# Patient Record
Sex: Female | Born: 1985 | Race: Black or African American | Hispanic: No | Marital: Single | Smoking: Former smoker
Health system: Southern US, Community
[De-identification: ages and names within clinical notes are randomized; demographics above are authoritative.]

## PROBLEM LIST (undated history)

## (undated) ENCOUNTER — Inpatient Hospital Stay: Payer: Self-pay

## (undated) DIAGNOSIS — O1420 HELLP syndrome (HELLP), unspecified trimester: Secondary | ICD-10-CM

## (undated) DIAGNOSIS — E119 Type 2 diabetes mellitus without complications: Secondary | ICD-10-CM

## (undated) DIAGNOSIS — I10 Essential (primary) hypertension: Secondary | ICD-10-CM

## (undated) DIAGNOSIS — G43909 Migraine, unspecified, not intractable, without status migrainosus: Secondary | ICD-10-CM

## (undated) DIAGNOSIS — O1490 Unspecified pre-eclampsia, unspecified trimester: Secondary | ICD-10-CM

## (undated) DIAGNOSIS — D649 Anemia, unspecified: Secondary | ICD-10-CM

## (undated) DIAGNOSIS — R4020 Unspecified coma: Secondary | ICD-10-CM

## (undated) HISTORY — DX: Unspecified coma: R40.20

## (undated) HISTORY — DX: Unspecified pre-eclampsia, unspecified trimester: O14.90

## (undated) HISTORY — PX: NO PAST SURGERIES: SHX2092

---

## 2010-08-24 ENCOUNTER — Emergency Department: Payer: Self-pay | Admitting: Emergency Medicine

## 2010-08-29 ENCOUNTER — Emergency Department: Payer: Self-pay | Admitting: Emergency Medicine

## 2012-01-19 DIAGNOSIS — O139 Gestational [pregnancy-induced] hypertension without significant proteinuria, unspecified trimester: Secondary | ICD-10-CM

## 2013-09-20 DIAGNOSIS — O24419 Gestational diabetes mellitus in pregnancy, unspecified control: Secondary | ICD-10-CM

## 2013-09-20 DIAGNOSIS — O139 Gestational [pregnancy-induced] hypertension without significant proteinuria, unspecified trimester: Secondary | ICD-10-CM

## 2015-08-12 ENCOUNTER — Other Ambulatory Visit: Payer: Self-pay | Admitting: Family Medicine

## 2015-08-12 DIAGNOSIS — R55 Syncope and collapse: Secondary | ICD-10-CM

## 2015-08-12 DIAGNOSIS — G44009 Cluster headache syndrome, unspecified, not intractable: Secondary | ICD-10-CM

## 2015-08-13 ENCOUNTER — Ambulatory Visit
Admission: RE | Admit: 2015-08-13 | Discharge: 2015-08-13 | Disposition: A | Discharge status: Home / Self Care | Payer: Medicaid Other | Source: Ambulatory Visit | Attending: Family Medicine | Admitting: Family Medicine

## 2015-08-13 DIAGNOSIS — R55 Syncope and collapse: Secondary | ICD-10-CM | POA: Diagnosis present

## 2015-08-13 DIAGNOSIS — R040 Epistaxis: Secondary | ICD-10-CM | POA: Insufficient documentation

## 2015-08-13 DIAGNOSIS — G44009 Cluster headache syndrome, unspecified, not intractable: Secondary | ICD-10-CM | POA: Diagnosis present

## 2015-08-13 MED ORDER — GADOBENATE DIMEGLUMINE 529 MG/ML IV SOLN
15.0000 mL | Freq: Once | INTRAVENOUS | Status: AC | PRN
  Administered 2015-08-13: 15 mL via INTRAVENOUS

## 2015-11-28 ENCOUNTER — Emergency Department (HOSPITAL_COMMUNITY): Payer: Medicaid Other

## 2015-11-28 ENCOUNTER — Emergency Department (HOSPITAL_COMMUNITY)
Admission: EM | Admit: 2015-11-28 | Discharge: 2015-11-28 | Disposition: A | Discharge status: Home / Self Care | Payer: Medicaid Other | Attending: Emergency Medicine | Admitting: Emergency Medicine

## 2015-11-28 ENCOUNTER — Encounter (HOSPITAL_COMMUNITY): Payer: Self-pay | Admitting: Emergency Medicine

## 2015-11-28 DIAGNOSIS — R102 Pelvic and perineal pain: Secondary | ICD-10-CM | POA: Diagnosis present

## 2015-11-28 DIAGNOSIS — N760 Acute vaginitis: Secondary | ICD-10-CM | POA: Insufficient documentation

## 2015-11-28 DIAGNOSIS — B9689 Other specified bacterial agents as the cause of diseases classified elsewhere: Secondary | ICD-10-CM

## 2015-11-28 DIAGNOSIS — Z79899 Other long term (current) drug therapy: Secondary | ICD-10-CM | POA: Diagnosis not present

## 2015-11-28 DIAGNOSIS — E119 Type 2 diabetes mellitus without complications: Secondary | ICD-10-CM | POA: Diagnosis not present

## 2015-11-28 DIAGNOSIS — N83202 Unspecified ovarian cyst, left side: Secondary | ICD-10-CM | POA: Diagnosis not present

## 2015-11-28 DIAGNOSIS — I10 Essential (primary) hypertension: Secondary | ICD-10-CM | POA: Diagnosis not present

## 2015-11-28 DIAGNOSIS — F17210 Nicotine dependence, cigarettes, uncomplicated: Secondary | ICD-10-CM | POA: Insufficient documentation

## 2015-11-28 HISTORY — DX: Essential (primary) hypertension: I10

## 2015-11-28 HISTORY — DX: Type 2 diabetes mellitus without complications: E11.9

## 2015-11-28 HISTORY — DX: Migraine, unspecified, not intractable, without status migrainosus: G43.909

## 2015-11-28 HISTORY — DX: Anemia, unspecified: D64.9

## 2015-11-28 LAB — CBC WITH DIFFERENTIAL/PLATELET
BASOS ABS: 0 10*3/uL (ref 0.0–0.1)
Basophils Relative: 0 %
EOS PCT: 1 %
Eosinophils Absolute: 0 10*3/uL (ref 0.0–0.7)
HEMATOCRIT: 34.1 % — AB (ref 36.0–46.0)
Hemoglobin: 11.4 g/dL — ABNORMAL LOW (ref 12.0–15.0)
Lymphocytes Relative: 61 %
Lymphs Abs: 2.1 10*3/uL (ref 0.7–4.0)
MCH: 28.6 pg (ref 26.0–34.0)
MCHC: 33.4 g/dL (ref 30.0–36.0)
MCV: 85.5 fL (ref 78.0–100.0)
MONO ABS: 0.2 10*3/uL (ref 0.1–1.0)
MONOS PCT: 7 %
NEUTROS ABS: 1 10*3/uL — AB (ref 1.7–7.7)
Neutrophils Relative %: 31 %
PLATELETS: 255 10*3/uL (ref 150–400)
RBC: 3.99 MIL/uL (ref 3.87–5.11)
RDW: 13 % (ref 11.5–15.5)
WBC: 3.4 10*3/uL — AB (ref 4.0–10.5)

## 2015-11-28 LAB — URINALYSIS, ROUTINE W REFLEX MICROSCOPIC
BILIRUBIN URINE: NEGATIVE
GLUCOSE, UA: NEGATIVE mg/dL
Ketones, ur: NEGATIVE mg/dL
Leukocytes, UA: NEGATIVE
Nitrite: NEGATIVE
PROTEIN: NEGATIVE mg/dL
pH: 6 (ref 5.0–8.0)

## 2015-11-28 LAB — WET PREP, GENITAL
SPERM: NONE SEEN
Trich, Wet Prep: NONE SEEN
YEAST WET PREP: NONE SEEN

## 2015-11-28 LAB — COMPREHENSIVE METABOLIC PANEL
ALBUMIN: 3.7 g/dL (ref 3.5–5.0)
ALT: 12 U/L — ABNORMAL LOW (ref 14–54)
ANION GAP: 2 — AB (ref 5–15)
AST: 12 U/L — AB (ref 15–41)
Alkaline Phosphatase: 42 U/L (ref 38–126)
BILIRUBIN TOTAL: 0.8 mg/dL (ref 0.3–1.2)
BUN: 8 mg/dL (ref 6–20)
CHLORIDE: 108 mmol/L (ref 101–111)
CO2: 27 mmol/L (ref 22–32)
Calcium: 8.6 mg/dL — ABNORMAL LOW (ref 8.9–10.3)
Creatinine, Ser: 0.75 mg/dL (ref 0.44–1.00)
GFR calc Af Amer: >60 mL/min (ref >60)
GLUCOSE: 92 mg/dL (ref 65–99)
POTASSIUM: 3.4 mmol/L — AB (ref 3.5–5.1)
Sodium: 137 mmol/L (ref 135–145)
TOTAL PROTEIN: 6.3 g/dL — AB (ref 6.5–8.1)

## 2015-11-28 LAB — URINE MICROSCOPIC-ADD ON

## 2015-11-28 LAB — PREGNANCY, URINE: Preg Test, Ur: NEGATIVE

## 2015-11-28 MED ORDER — NAPROXEN 250 MG PO TABS: 250.0000 mg | ORAL_TABLET | Freq: Two times a day (BID) | ORAL | Status: DC | PRN

## 2015-11-28 MED ORDER — METRONIDAZOLE 500 MG PO TABS: 500.0000 mg | ORAL_TABLET | Freq: Two times a day (BID) | ORAL | Status: DC

## 2015-11-28 NOTE — Discharge Instructions (Signed)
Take the prescriptions as directed.  Apply moist heat to the area(s) of discomfort, for 15 minutes at a time, several times per day for the next few days.  Do not fall asleep on a heating pack.  Call the OB/GYN doctor on Monday to schedule a follow up appointment this week.  Return to the Emergency Department immediately if worsening.

## 2015-11-28 NOTE — ED Provider Notes (Signed)
CSN: [id number]     Arrival date & time 11/28/15  1018 History   First MD Initiated Contact with Patient 11/28/15 1047     Chief Complaint  Patient presents with  . Pelvic Pain      HPI Pt was seen at 1045. Per pt, c/o gradual onset and persistence of constant vaginal discharge for the past several days. Has been associated with pelvic "pain."  Denies dysuria/hematuria, no flank pain, no N/V/D, no fevers, no rash.     Past Medical History  Diagnosis Date  . Diabetes mellitus without complication (HCC)   . Hypertension   . Anemia   . Migraines    History reviewed. No pertinent past surgical history.   Family History  Problem Relation Age of Onset  . Stroke Brother   . Seizures Other   . Hypertension Other    Social History  Substance Use Topics  . Smoking status: Current Some Day Smoker    Types: Cigars  . Smokeless tobacco: Never Used  . Alcohol Use: No   OB History    Gravida Para Term Preterm AB TAB SAB Ectopic Multiple Living   4 3 3  1  1   3      Review of Systems ROS: Statement: All systems negative except as marked or noted in the HPI; Constitutional: Negative for fever and chills. ; ; Eyes: Negative for eye pain, redness and discharge. ; ; ENMT: Negative for ear pain, hoarseness, nasal congestion, sinus pressure and sore throat. ; ; Cardiovascular: Negative for chest pain, palpitations, diaphoresis, dyspnea and peripheral edema. ; ; Respiratory: Negative for cough, wheezing and stridor. ; ; Gastrointestinal: Negative for nausea, vomiting, diarrhea, abdominal pain, blood in stool, hematemesis, jaundice and rectal bleeding. . ; ; Genitourinary: Negative for dysuria, flank pain and hematuria. ; ; GYN: +pelvic pain, no vaginal bleeding, +vaginal discharge, no vulvar pain. ;;  Musculoskeletal: Negative for back pain and neck pain. Negative for swelling and trauma.; ; Skin: Negative for pruritus, rash, abrasions, blisters, bruising and skin lesion.; ; Neuro: Negative for  headache, lightheadedness and neck stiffness. Negative for weakness, altered level of consciousness, altered mental status, extremity weakness, paresthesias, involuntary movement, seizure and syncope.      Allergies  Mushroom extract complex  Home Medications   Prior to Admission medications   Medication Sig Start Date End Date Taking? Authorizing Provider  amitriptyline (ELAVIL) 25 MG tablet Take 25 mg by mouth at bedtime.   Yes Historical Provider, MD  hydrOXYzine (ATARAX/VISTARIL) 10 MG tablet Take 10 mg by mouth 3 (three) times daily as needed for anxiety.   Yes Historical Provider, MD  rizatriptan (MAXALT) 10 MG tablet Take 10 mg by mouth as needed for migraine. May repeat in 2 hours if needed   Yes Historical Provider, MD  sertraline (ZOLOFT) 25 MG tablet Take 50 mg by mouth daily.   Yes Historical Provider, MD   BP 126/97 mmHg  Pulse 62  Temp(Src) 98 F (36.7 C) (Oral)  Resp 18  Ht 5' 5.5" (1.664 m)  Wt 155 lb (70.308 kg)  BMI 25.39 kg/m2  SpO2 100%  LMP 11/12/2015 Physical Exam  1050: Physical examination:  Nursing notes reviewed; Vital signs and O2 SAT reviewed;  Constitutional: Well developed, Well nourished, Well hydrated, In no acute distress; Head:  Normocephalic, atraumatic; Eyes: EOMI, PERRL, No scleral icterus; ENMT: Mouth and pharynx normal, Mucous membranes moist; Neck: Supple, Full range of motion, No lymphadenopathy; Cardiovascular: Regular rate and rhythm, No murmur, rub,  or gallop; Respiratory: Breath sounds clear & equal bilaterally, No rales, rhonchi, wheezes.  Speaking full sentences with ease, Normal respiratory effort/excursion; Chest: Nontender, Movement normal; Abdomen: Soft, +suprapubic area tender to palp. No rebound or guarding. Nondistended, Normal bowel sounds; Genitourinary: No CVA tenderness. Pelvic exam performed with permission of pt and female ED tech assist during exam.  External genitalia w/o lesions. Vaginal vault with thick yellow discharge.   Cervix w/o lesions, not friable, GC/chlam and wet prep obtained and sent to lab.  Bimanual exam w/o CMT. +uterine > mild bilateral adnexal tenderness.; Extremities: Pulses normal, No tenderness, No edema, No calf edema or asymmetry.; Neuro: AA&Ox3, Major CN grossly intact.  Speech clear. No gross focal motor or sensory deficits in extremities.; Skin: Color normal, Warm, Dry.   ED Course  Procedures (including critical care time) Labs Review  Imaging Review  I have personally reviewed and evaluated these images and lab results as part of my medical decision-making.   EKG Interpretation None      MDM  MDM Reviewed: previous chart, nursing note and vitals Reviewed previous: labs Interpretation: labs and ultrasound      Results for orders placed or performed during the hospital encounter of 11/28/15  Wet prep, genital  Result Value Ref Range   Yeast Wet Prep HPF POC NONE SEEN NONE SEEN   Trich, Wet Prep NONE SEEN NONE SEEN   Clue Cells Wet Prep HPF POC PRESENT (A) NONE SEEN   WBC, Wet Prep HPF POC FEW (A) NONE SEEN   Sperm NONE SEEN   Urinalysis, Routine w reflex microscopic (not at [facility])  Result Value Ref Range   Color, Urine YELLOW YELLOW   APPearance HAZY (A) CLEAR   Specific Gravity, Urine >1.030 (H) 1.005 - 1.030   pH 6.0 5.0 - 8.0   Glucose, UA NEGATIVE NEGATIVE mg/dL   Hgb urine dipstick TRACE (A) NEGATIVE   Bilirubin Urine NEGATIVE NEGATIVE   Ketones, ur NEGATIVE NEGATIVE mg/dL   Protein, ur NEGATIVE NEGATIVE mg/dL   Nitrite NEGATIVE NEGATIVE   Leukocytes, UA NEGATIVE NEGATIVE  CBC with Differential  Result Value Ref Range   WBC 3.4 (L) 4.0 - 10.5 K/uL   RBC 3.99 3.87 - 5.11 MIL/uL   Hemoglobin 11.4 (L) 12.0 - 15.0 g/dL   HCT 96.034.1 (L) 45.436.0 - 09.846.0 %   MCV 85.5 78.0 - 100.0 fL   MCH 28.6 26.0 - 34.0 pg   MCHC 33.4 30.0 - 36.0 g/dL   RDW 11.913.0 14.711.5 - 82.915.5 %   Platelets 255 150 - 400 K/uL   Neutrophils Relative % 31 %   Neutro Abs 1.0 (L) 1.7 - 7.7 K/uL    Lymphocytes Relative 61 %   Lymphs Abs 2.1 0.7 - 4.0 K/uL   Monocytes Relative 7 %   Monocytes Absolute 0.2 0.1 - 1.0 K/uL   Eosinophils Relative 1 %   Eosinophils Absolute 0.0 0.0 - 0.7 K/uL   Basophils Relative 0 %   Basophils Absolute 0.0 0.0 - 0.1 K/uL  Comprehensive metabolic panel  Result Value Ref Range   Sodium 137 135 - 145 mmol/L   Potassium 3.4 (L) 3.5 - 5.1 mmol/L   Chloride 108 101 - 111 mmol/L   CO2 27 22 - 32 mmol/L   Glucose, Bld 92 65 - 99 mg/dL   BUN 8 6 - 20 mg/dL   Creatinine, Ser 5.620.75 0.44 - 1.00 mg/dL   Calcium 8.6 (L) 8.9 - 10.3 mg/dL   Total Protein 6.3 (  L) 6.5 - 8.1 g/dL   Albumin 3.7 3.5 - 5.0 g/dL   AST 12 (L) 15 - 41 U/L   ALT 12 (L) 14 - 54 U/L   Alkaline Phosphatase 42 38 - 126 U/L   Total Bilirubin 0.8 0.3 - 1.2 mg/dL   GFR calc non Af Amer >60 >60 mL/min   GFR calc Af Amer >60 >60 mL/min   Anion gap 2 (L) 5 - 15  Pregnancy, urine  Result Value Ref Range   Preg Test, Ur NEGATIVE NEGATIVE  Urine microscopic-add on  Result Value Ref Range   Squamous Epithelial / LPF 6-30 (A) NONE SEEN   WBC, UA 0-5 0 - 5 WBC/hpf   RBC / HPF 0-5 0 - 5 RBC/hpf   Bacteria, UA MANY (A) NONE SEEN   Urine-Other MUCOUS PRESENT    US Pelvis Complete 11/28/2015  CLINICAL DATA:  Acute diffuse pelvic pain since yesterday. EXAM: TRANSABDOMINAL AND TRANSVAGINAL ULTRASOUND OF PELVIS DOPPLER ULTRASOUND OF OVARIES TECHNIQUE: Both transabdominal and transvaginal ultrasound examinations of the pelvis were performed. Transabdominal technique was performed for global imaging of the pelvis including uterus, ovaries, adnexal regions, and pelvic cul-de-sac. It was necessary to proceed with endovaginal exam following the transabdominal exam to visualize the uterus and adnexae in better detail. Color and duplex Doppler ultrasound was utilized to evaluate blood flow to the ovaries. COMPARISON:  08/29/2010 FINDINGS: Uterus Measurements: 7.9 x 3.5 x 6.0 cm. No fibroids or other mass visualized.  Endometrium Thickness: 11.7 mm.  No focal abnormality visualized. Right ovary Measurements: 4.1 x 2.4 x 3.0 cm. Normal appearance/no adnexal mass. Left ovary Measurements: 4.9 x 2.7 x 3.5 cm. Left ovary contains a minimally complex septated hypoechoic cyst measuring 22 x 16 x 20 mm. Pulsed Doppler evaluation of both ovaries demonstrates normal low-resistance arterial and venous waveforms. Other findings Small amount of free fluid in the cul-de-sac noted, suspect physiologic. IMPRESSION: 2.2 cm left ovarian minimally complex cyst. No other acute finding by ultrasound or Doppler evaluation. Electronically Signed   By: Judie Petit.  Shick M.D.   On: 11/28/2015 13:42    1425:  Ovarian cyst on [place]. Udip contaminated. Tx for BV. Pt states she is ready to go home now. Dx and testing d/w pt.  Questions answered.  Verb understanding, agreeable to d/c home with outpt f/u.   Samuel Jester, DO 11/30/15 2217

## 2015-11-28 NOTE — ED Notes (Addendum)
Patient c/o lower abd pain that started yesterday. Denies any nausea, vomiting, diarrhea, fevers, or vaginal bleeding. Patient does report some urinary retention. Patient also states "hot and cold flashes." Per patient last BM yesterday-normal, no blood noted.

## 2015-11-28 NOTE — ED Notes (Signed)
Reported that pt had IUD removed this past May

## 2015-11-30 LAB — GC/CHLAMYDIA PROBE AMP (~~LOC~~) NOT AT ARMC
CHLAMYDIA, DNA PROBE: NEGATIVE
Neisseria Gonorrhea: NEGATIVE

## 2016-03-19 ENCOUNTER — Encounter (HOSPITAL_COMMUNITY): Payer: Self-pay | Admitting: Emergency Medicine

## 2016-03-19 ENCOUNTER — Emergency Department (HOSPITAL_COMMUNITY): Payer: Medicaid Other

## 2016-03-19 ENCOUNTER — Emergency Department (HOSPITAL_COMMUNITY)
Admission: EM | Admit: 2016-03-19 | Discharge: 2016-03-19 | Disposition: A | Discharge status: Home / Self Care | Payer: Medicaid Other | Attending: Emergency Medicine | Admitting: Emergency Medicine

## 2016-03-19 DIAGNOSIS — Z79899 Other long term (current) drug therapy: Secondary | ICD-10-CM | POA: Insufficient documentation

## 2016-03-19 DIAGNOSIS — Z3490 Encounter for supervision of normal pregnancy, unspecified, unspecified trimester: Secondary | ICD-10-CM

## 2016-03-19 DIAGNOSIS — O9989 Other specified diseases and conditions complicating pregnancy, childbirth and the puerperium: Secondary | ICD-10-CM | POA: Insufficient documentation

## 2016-03-19 DIAGNOSIS — I10 Essential (primary) hypertension: Secondary | ICD-10-CM | POA: Diagnosis not present

## 2016-03-19 DIAGNOSIS — F17290 Nicotine dependence, other tobacco product, uncomplicated: Secondary | ICD-10-CM | POA: Diagnosis not present

## 2016-03-19 DIAGNOSIS — N898 Other specified noninflammatory disorders of vagina: Secondary | ICD-10-CM | POA: Insufficient documentation

## 2016-03-19 DIAGNOSIS — O99331 Smoking (tobacco) complicating pregnancy, first trimester: Secondary | ICD-10-CM | POA: Diagnosis not present

## 2016-03-19 DIAGNOSIS — Z3A01 Less than 8 weeks gestation of pregnancy: Secondary | ICD-10-CM | POA: Insufficient documentation

## 2016-03-19 DIAGNOSIS — O0001 Abdominal pregnancy with intrauterine pregnancy: Secondary | ICD-10-CM | POA: Insufficient documentation

## 2016-03-19 DIAGNOSIS — O26891 Other specified pregnancy related conditions, first trimester: Secondary | ICD-10-CM | POA: Diagnosis present

## 2016-03-19 DIAGNOSIS — R102 Pelvic and perineal pain: Secondary | ICD-10-CM

## 2016-03-19 DIAGNOSIS — O26899 Other specified pregnancy related conditions, unspecified trimester: Secondary | ICD-10-CM

## 2016-03-19 DIAGNOSIS — E119 Type 2 diabetes mellitus without complications: Secondary | ICD-10-CM | POA: Diagnosis not present

## 2016-03-19 DIAGNOSIS — M545 Low back pain: Secondary | ICD-10-CM | POA: Insufficient documentation

## 2016-03-19 LAB — WET PREP, GENITAL
Sperm: NONE SEEN
TRICH WET PREP: NONE SEEN
Yeast Wet Prep HPF POC: NONE SEEN

## 2016-03-19 LAB — URINALYSIS, ROUTINE W REFLEX MICROSCOPIC
Bilirubin Urine: NEGATIVE
GLUCOSE, UA: NEGATIVE mg/dL
HGB URINE DIPSTICK: NEGATIVE
Ketones, ur: NEGATIVE mg/dL
Nitrite: NEGATIVE
PH: 6.5 (ref 5.0–8.0)
PROTEIN: NEGATIVE mg/dL
SPECIFIC GRAVITY, URINE: 1.02 (ref 1.005–1.030)

## 2016-03-19 LAB — HCG, QUANTITATIVE, PREGNANCY: HCG, BETA CHAIN, QUANT, S: 59658 m[IU]/mL — AB (ref <5)

## 2016-03-19 LAB — PREGNANCY, URINE: Preg Test, Ur: POSITIVE — AB

## 2016-03-19 LAB — URINE MICROSCOPIC-ADD ON: RBC / HPF: NONE SEEN RBC/hpf (ref 0–5)

## 2016-03-19 MED ORDER — ACETAMINOPHEN 325 MG PO TABS
650.0000 mg | ORAL_TABLET | Freq: Once | ORAL | Status: AC
  Administered 2016-03-19: 650 mg via ORAL
  Filled 2016-03-19: qty 2

## 2016-03-19 NOTE — ED Provider Notes (Signed)
[facility]EM[facility]Y DEPT Provider Note   CSN: [id number] Arrival date & time: 03/19/16  [id number]  By signing my name below, I, [name], attest that this documentation has been prepared under the direction and in the presence of Doug SouSam Latera Mclin, MD. Electronically Signed: Angelene GiovanniEmmanuella Mensah, ED Scribe. 03/19/16. 11:22 AM.   History   Chief Complaint Chief Complaint  Patient presents with  . 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 No nausea or vomiting. No fever. She reports associated pink colored vaginal discharge for the past 2 weeks and lower back pain. She notes that movement makes the pain worse but staying still makes the pain better. She states that she has tried Tylenol with no relief. She explains that she was seen on [facility]16/16 [facility] and was informed of her current pregnancy. She reports that her LNMP was in August and she had an "abnormal" menstrual cycle in September. She adds that it was abnormal because "it would come and go".Last normal menstrual period was August 2017 She states that this is her 5th pregnancy; 3 live children and one still birth. Pt has an allergy to Flagyl. She states that she is a current smoker. She denies any fever, vomiting, dysuria, vaginal pain, bowel/bladder incontinence, or any other symptoms.   The history is provided by the patient. No language interpreter was used.  Abdominal Pain   This is a new problem. The current episode started yesterday. The problem has been gradually improving. The pain is located in the LLQ. The pain is moderate. Pertinent negatives include fever, nausea, vomiting and dysuria. The symptoms are aggravated by certain positions.     Past Medical History:  Diagnosis Date  . Anemia   . Diabetes mellitus without complication (HCC)   . Hypertension   . Migraines     There are no active problems to display for this patient.   History reviewed. No pertinent surgical history.  OB History    Gravida Para Term Preterm AB Living   5 3 3   1 3    SAB TAB Ectopic Multiple Live Births   1               Home Medications    Prior to Admission medications   Medication Sig Start Date End Date Taking? Authorizing Provider  hydrOXYzine (ATARAX/VISTARIL) 10 MG tablet Take 10 mg by mouth 3 (three) times daily as needed for anxiety.   Yes Historical Provider, MD  Prenatal Vit-Fe Fumarate-FA (PRENATAL ONE DAILY PO) Take 1 tablet by mouth daily.   Yes Historical Provider, MD  naproxen (NAPROSYN) 250 MG tablet Take 1 tablet (250 mg total) by mouth 2 (two) times daily as needed for mild pain or moderate pain (take with food). 11/28/15   Samuel JesterKathleen McManus, DO  rizatriptan (MAXALT) 10 MG tablet Take 10 mg by mouth as needed for migraine. May repeat in 2 hours if needed    Historical Provider, MD  sertraline (ZOLOFT) 25 MG tablet Take 50 mg by mouth daily.    Historical Provider, MD  Renal vitamins  Family History Family History  Problem Relation Age of Onset  . Stroke Brother   . Seizures Other   . Hypertension Other     Social History  Social History  Substance Use Topics  . Smoking status: Current Some Day Smoker    Types: Cigars  . Smokeless tobacco: Never Used  . Alcohol use No   Denies tobacco and denies alcohol denies illicit drug use  Allergies   Mushroom extract complex and Flagyl [metronidazole]   Review of Systems Review of Systems  Constitutional: Negative.  Negative for fever.  HENT: Negative.   Respiratory: Negative.   Cardiovascular: Negative.   Gastrointestinal: Positive for abdominal pain. Negative for nausea and vomiting.  Genitourinary: Positive for vaginal discharge. Negative for dysuria  and vaginal pain.       Pregnant  Musculoskeletal: Positive for back pain.  Skin: Negative.   Neurological: Negative.   Psychiatric/Behavioral: Negative.   All other systems reviewed and are negative.    Physical Exam Updated Vital Signs BP 127/76 (BP Location: Right Arm)   Pulse 92   Temp 98.1 F (36.7 C) (Oral)   Resp 18   Ht 5' 5.5" (1.664 m)   Wt 157 lb (71.2 kg)   LMP 01/31/2016   SpO2 100%   BMI 25.73 kg/m   Physical Exam  Constitutional: She appears well-developed and well-nourished.  HENT:  Head: Normocephalic and atraumatic.  Eyes: Conjunctivae are normal. Pupils are equal, round, and reactive to light.  Neck: Neck supple. No tracheal deviation present. No thyromegaly present.  Cardiovascular: Normal rate and regular rhythm.   No murmur heard. Pulmonary/Chest: Effort normal and breath sounds normal.  Abdominal: Soft. Bowel sounds are normal. She exhibits no distension. There is no tenderness.  Genitourinary:  Genitourinary Comments: Left flank tenderness. Pelvic exam no external lesion. Thick yellow discharge. Cervical os closed. No cervical motion tenderness positive left adnexal tenderness  Musculoskeletal: Normal range of motion. She exhibits no edema or tenderness.  Neurological: She is alert. Coordination normal.  Skin: Skin is warm and dry. No rash noted.  Psychiatric: She has a normal mood and affect.  Nursing note and vitals reviewed.    ED Treatments / Results  DIAGNOSTIC STUDIES: Oxygen Saturation is 100% on RA, normal by my interpretation.    COORDINATION OF CARE: 11:19 AM- Pt advised of plan for treatment and pt agrees. Pt will receive pelvic examination and US pelvis for further evaluation.    Labs (all labs ordered are listed, but only abnormal results are displayed) Labs Reviewed  URINALYSIS, ROUTINE W REFLEX MICROSCOPIC (NOT AT [facility]) - Abnormal; Notable for the following:       Result Value   APPearance HAZY (*)    Leukocytes, UA TRACE  (*)    All other components within normal limits  PREGNANCY, URINE - Abnormal; Notable for the following:    Preg Test, Ur POSITIVE (*)    All other components within normal limits  URINE MICROSCOPIC-ADD ON - Abnormal; Notable for the following:    Squamous Epithelial / LPF 6-30 (*)    Bacteria, UA FEW (*)    All other components within normal limits    EKG  EKG Interpretation None       Radiology No results found.  Procedures Procedures (including critical care time)  Medications Ordered in ED Medications - No data to display   Initial Impression / Assessment and Plan / ED Course  Doug SouSam Saajan Willmon, MD has reviewed the triage vital signs and the nursing notes.  Pertinent labs & imaging results that were available during my care of the patient were reviewed by me and considered in my medical decision making (see chart for  details).  Clinical Course    3:05 PM patient asymptomatic except for mild left-sided low back pain. Plan Tylenol for pain. She is already taking prenatal vitamins HIV, RPR, cervical cultures pending Results for orders placed or performed during the hospital encounter of 03/19/16  Wet prep, genital  Result Value Ref Range   Yeast Wet Prep HPF POC NONE SEEN NONE SEEN   Trich, Wet Prep NONE SEEN NONE SEEN   Clue Cells Wet Prep HPF POC PRESENT (A) NONE SEEN   WBC, Wet Prep HPF POC RARE (A) NONE SEEN   Sperm NONE SEEN   Urinalysis, Routine w reflex microscopic (not at [facility])  Result Value Ref Range   Color, Urine YELLOW YELLOW   APPearance HAZY (A) CLEAR   Specific Gravity, Urine 1.020 1.005 - 1.030   pH 6.5 5.0 - 8.0   Glucose, UA NEGATIVE NEGATIVE mg/dL   Hgb urine dipstick NEGATIVE NEGATIVE   Bilirubin Urine NEGATIVE NEGATIVE   Ketones, ur NEGATIVE NEGATIVE mg/dL   Protein, ur NEGATIVE NEGATIVE mg/dL   Nitrite NEGATIVE NEGATIVE   Leukocytes, UA TRACE (A) NEGATIVE  Pregnancy, urine  Result Value Ref Range   Preg Test, Ur POSITIVE (A) NEGATIVE   Urine microscopic-add on  Result Value Ref Range   Squamous Epithelial / LPF 6-30 (A) NONE SEEN   WBC, UA 6-30 0 - 5 WBC/hpf   RBC / HPF NONE SEEN 0 - 5 RBC/hpf   Bacteria, UA FEW (A) NONE SEEN  hCG, quantitative, pregnancy  Result Value Ref Range   hCG, Beta Chain, Quant, S 59,658 (H) <5 mIU/mL   US Ob Comp Less 14 Wks  Result Date: 03/19/2016 CLINICAL DATA:  30 year old pregnant female with 12 hours of left lower quadrant pain. Quantitative beta HCG [id number]. EDC by LMP: 11/06/2016, projecting to an expected gestational age of [redacted] weeks 6 days. EXAM: OBSTETRIC <14 WK [place] AND TRANSVAGINAL OB US TECHNIQUE: Both transabdominal and transvaginal ultrasound examinations were performed for complete evaluation of the gestation as well as the maternal uterus, adnexal regions, and pelvic cul-de-sac. Transvaginal technique was performed to assess early pregnancy. COMPARISON:  No prior obstetric scans from this gestation. 11/28/2015 pelvic sonogram. FINDINGS: Intrauterine gestational sac: Single intrauterine gestational sac appears normal in size, shape and position. Yolk sac:  Present. Embryo:  Present. Embryonic Cardiac Activity: Regular rate and rhythm. Embryonic Heart Rate: 135  bpm CRL:  9.8  mm   7 w   0 d                  [place] EDC: 11/05/2016 Subchorionic hemorrhage:  None visualized. Maternal uterus/adnexae: Anteverted uterus with no uterine fibroids demonstrated. No abnormal free fluid the pelvis. Right ovary measures 4.7 x 2.3 x 4.9 cm and contains a 2.7 cm corpus luteum. Left ovary measures 3.5 x 1.8 x 2.6 cm. No suspicious ovarian or adnexal masses. IMPRESSION: 1. Single living intrauterine gestation at 7 weeks 0 days by crown-rump length, concordant with the provided menstrual dating. 2. No early first-trimester gestational abnormality. Electronically Signed   By: Delbert Phenix M.D.   On: 03/19/2016 13:28   US Ob Transvaginal  Result Date: 03/19/2016 CLINICAL DATA:  30 year old pregnant female with 12  hours of left lower quadrant pain. Quantitative beta HCG [id number]. EDC by LMP: 11/06/2016, projecting to an expected gestational age of [redacted] weeks 6 days. EXAM: OBSTETRIC <14 WK [place] AND TRANSVAGINAL OB US TECHNIQUE: Both transabdominal and transvaginal ultrasound examinations were performed for complete evaluation of the  gestation as well as the maternal uterus, adnexal regions, and pelvic cul-de-sac. Transvaginal technique was performed to assess early pregnancy. COMPARISON:  No prior obstetric scans from this gestation. 11/28/2015 pelvic sonogram. FINDINGS: Intrauterine gestational sac: Single intrauterine gestational sac appears normal in size, shape and position. Yolk sac:  Present. Embryo:  Present. Embryonic Cardiac Activity: Regular rate and rhythm. Embryonic Heart Rate: 135  bpm CRL:  9.8  mm   7 w   0 d                  [place] EDC: 11/05/2016 Subchorionic hemorrhage:  None visualized. Maternal uterus/adnexae: Anteverted uterus with no uterine fibroids demonstrated. No abnormal free fluid the pelvis. Right ovary measures 4.7 x 2.3 x 4.9 cm and contains a 2.7 cm corpus luteum. Left ovary measures 3.5 x 1.8 x 2.6 cm. No suspicious ovarian or adnexal masses. IMPRESSION: 1. Single living intrauterine gestation at 7 weeks 0 days by crown-rump length, concordant with the provided menstrual dating. 2. No early first-trimester gestational abnormality. Electronically Signed   By: Delbert Phenix M.D.   On: 03/19/2016 13:28   Abdominal pain is felt to be nonspecific, she did have left adnexal tenderness on pelvic exam but no abdominal tenderness. Back pain likely musculoskeletal in etiology Final Clinical Impressions(s) / ED Diagnoses   Final diagnoses:  None   Diagnosis #1 intrauterine pregnancy #2 left lower quadrant abdominal pain #3 low back pain New Prescriptions New Prescriptions   No medications on file        Doug Sou, MD 03/19/16 1514

## 2016-03-19 NOTE — ED Notes (Signed)
EDP at bedside. Pelvic to be performed

## 2016-03-19 NOTE — Discharge Instructions (Signed)
Ultrasound showed pregnancy to be normal and 7 weeks. Take Tylenol as directed for pain . Keep your scheduled appointment for prenatal care.

## 2016-03-19 NOTE — ED Triage Notes (Signed)
Patient c/o lower left abd pain and lower back pain. Per patient has some pink discharge. Patient states [facility] and found out she was pregnant. Per patient last period was September 17th.Per patient 5th pregnancy. Per patient has had still birth x1.

## 2016-03-19 NOTE — ED Notes (Signed)
Returned from U/S

## 2016-03-19 NOTE — ED Notes (Signed)
Pt transported to US

## 2016-03-20 LAB — HIV ANTIBODY (ROUTINE TESTING W REFLEX): HIV Screen 4th Generation wRfx: NONREACTIVE

## 2016-03-20 LAB — RPR: RPR: NONREACTIVE

## 2016-03-21 LAB — GC/CHLAMYDIA PROBE AMP (~~LOC~~) NOT AT ARMC
Chlamydia: NEGATIVE
Neisseria Gonorrhea: POSITIVE — AB

## 2016-04-18 ENCOUNTER — Emergency Department
Admission: EM | Admit: 2016-04-18 | Discharge: 2016-04-18 | Disposition: A | Discharge status: Home / Self Care | Payer: Medicaid Other | Attending: Emergency Medicine | Admitting: Emergency Medicine

## 2016-04-18 ENCOUNTER — Encounter: Payer: Self-pay | Admitting: Emergency Medicine

## 2016-04-18 DIAGNOSIS — O99351 Diseases of the nervous system complicating pregnancy, first trimester: Secondary | ICD-10-CM | POA: Diagnosis present

## 2016-04-18 DIAGNOSIS — Z3A11 11 weeks gestation of pregnancy: Secondary | ICD-10-CM | POA: Diagnosis not present

## 2016-04-18 DIAGNOSIS — Z79899 Other long term (current) drug therapy: Secondary | ICD-10-CM | POA: Insufficient documentation

## 2016-04-18 DIAGNOSIS — F17290 Nicotine dependence, other tobacco product, uncomplicated: Secondary | ICD-10-CM | POA: Insufficient documentation

## 2016-04-18 DIAGNOSIS — O99331 Smoking (tobacco) complicating pregnancy, first trimester: Secondary | ICD-10-CM | POA: Insufficient documentation

## 2016-04-18 DIAGNOSIS — I10 Essential (primary) hypertension: Secondary | ICD-10-CM | POA: Diagnosis not present

## 2016-04-18 DIAGNOSIS — E119 Type 2 diabetes mellitus without complications: Secondary | ICD-10-CM | POA: Insufficient documentation

## 2016-04-18 DIAGNOSIS — G44009 Cluster headache syndrome, unspecified, not intractable: Secondary | ICD-10-CM | POA: Insufficient documentation

## 2016-04-18 LAB — GLUCOSE, CAPILLARY: GLUCOSE-CAPILLARY: 113 mg/dL — AB (ref 65–99)

## 2016-04-18 MED ORDER — SODIUM CHLORIDE 0.9 % IV BOLUS (SEPSIS)
1000.0000 mL | Freq: Once | INTRAVENOUS | Status: AC
  Administered 2016-04-18: 1000 mL via INTRAVENOUS

## 2016-04-18 MED ORDER — METOCLOPRAMIDE HCL 5 MG/ML IJ SOLN
10.0000 mg | Freq: Once | INTRAMUSCULAR | Status: AC
Start: 2016-04-18 — End: 2016-04-18
  Administered 2016-04-18: 10 mg via INTRAVENOUS
  Filled 2016-04-18 (×2): qty 2

## 2016-04-18 NOTE — ED Notes (Signed)
Pt placed on 2L [place]

## 2016-04-18 NOTE — ED Triage Notes (Signed)
Pt c/o headache, hx of cluster headaches.  PERRL, MAE, NAD

## 2016-04-18 NOTE — ED Provider Notes (Signed)
[facility] Emergency Department Provider Note  ____________________________________________   First MD Initiated Contact with Patient 04/18/16 1806     (approximate)  I have reviewed the triage vital signs and the nursing notes.   HISTORY  Chief Complaint Headache   HPI Marie Green is a 30 y.o. female with a history of cluster headaches was presenting to the emergency department with a headache over the past 4 days. She says the headache is on the left side of her head and feels like a "pain."  Has associated photophobia. No associated nausea and vomiting. No fever. Says that she is also 11 months pregnant but is not reporting any vaginal discharge or bleeding. Says she is taking prenatal vitamins. Says she is taking Tylenol at home without any relief. Says that she also has mild watering to her left eye. Says this is consistent with previous headaches.   Past Medical History:  Diagnosis Date  . Anemia   . Diabetes mellitus without complication (HCC)   . Hypertension   . Migraines     There are no active problems to display for this patient.   History reviewed. No pertinent surgical history.  Prior to Admission medications   Medication Sig Start Date End Date Taking? Authorizing Provider  hydrOXYzine (ATARAX/VISTARIL) 10 MG tablet Take 10 mg by mouth 3 (three) times daily as needed for anxiety.    Historical Provider, MD  naproxen (NAPROSYN) 250 MG tablet Take 1 tablet (250 mg total) by mouth 2 (two) times daily as needed for mild pain or moderate pain (take with food). 11/28/15   Samuel JesterKathleen McManus, DO  Prenatal Vit-Fe Fumarate-FA (PRENATAL ONE DAILY PO) Take 1 tablet by mouth daily.    Historical Provider, MD  rizatriptan (MAXALT) 10 MG tablet Take 10 mg by mouth as needed for migraine. May repeat in 2 hours if needed    Historical Provider, MD  sertraline (ZOLOFT) 25 MG tablet Take 50 mg by mouth daily.    Historical Provider, MD     Allergies Mushroom extract complex and Flagyl [metronidazole]  Family History  Problem Relation Age of Onset  . Stroke Brother   . Seizures Other   . Hypertension Other     Social History Social History  Substance Use Topics  . Smoking status: Current Some Day Smoker    Types: Cigars  . Smokeless tobacco: Never Used  . Alcohol use No    Review of Systems Constitutional: No fever/chills Eyes: No visual changes. ENT: No sore throat. Cardiovascular: Denies chest pain. Respiratory: Denies shortness of breath. Gastrointestinal: No abdominal pain.  No nausea, no vomiting.  No diarrhea.  No constipation. Genitourinary: Negative for dysuria. Musculoskeletal: Negative for back pain. Skin: Negative for rash. Neurological: Negative for focal weakness or numbness.  10-point ROS otherwise negative.  ____________________________________________   PHYSICAL EXAM:  VITAL SIGNS: ED Triage Vitals  Enc Vitals Group     BP 04/18/16 1655 126/77     Pulse Rate 04/18/16 1655 68     Resp --      Temp 04/18/16 1655 98.3 F (36.8 C)     Temp Source 04/18/16 1655 Oral     SpO2 04/18/16 1655 95 %     Weight 04/18/16 1657 160 lb (72.6 kg)     Height 04/18/16 1657 5\' 5"  (1.651 m)     Head Circumference --      Peak Flow --      Pain Score 04/18/16 1700 10  Pain Loc --      Pain Edu? --      Excl. in GC? --     Constitutional: Alert and oriented. Well appearing and in no acute distress. Eyes: Conjunctivae are normal. PERRL. EOMI. Head: Atraumatic. Nose: No congestion/rhinnorhea. Mouth/Throat: Mucous membranes are moist.   Neck: No stridor.   Cardiovascular: Normal rate, regular rhythm. Grossly normal heart sounds.   Respiratory: Normal respiratory effort.  No retractions. Lungs CTAB. Gastrointestinal: Soft and nontender. No distention. Musculoskeletal: No lower extremity tenderness nor edema.  No joint effusions. Neurologic:  Normal speech and language. No gross focal  neurologic deficits are appreciated. No gait instability. Skin:  Skin is warm, dry and intact. No rash noted. Psychiatric: Mood and affect are normal. Speech and behavior are normal.  ____________________________________________   LABS (all labs ordered are listed, but only abnormal results are displayed)  Labs Reviewed - No data to display ____________________________________________  EKG   ____________________________________________  RADIOLOGY   ____________________________________________   PROCEDURES  Procedure(s) performed:   Procedures  Critical Care performed:   ____________________________________________   INITIAL IMPRESSION / ASSESSMENT AND PLAN / ED COURSE  Pertinent labs & imaging results that were available during my care of the patient were reviewed by me and considered in my medical decision making (see chart for details).   Clinical Course   ----------------------------------------- 9:29 PM on 04/18/2016 -----------------------------------------  Patient pain-free after meds. Will be discharged home. She will follow up with her GYN.   ____________________________________________   FINAL CLINICAL IMPRESSION(S) / ED DIAGNOSES  Cluster headache.    NEW MEDICATIONS STARTED DURING THIS VISIT:  New Prescriptions   No medications on file     Note:  This document was prepared using Dragon voice recognition software and may include unintentional dictation errors.    Myrna Blazeravid Matthew Liylah Najarro, MD 04/18/16 2129

## 2016-07-02 ENCOUNTER — Encounter (HOSPITAL_COMMUNITY): Payer: Self-pay | Admitting: *Deleted

## 2016-07-02 ENCOUNTER — Emergency Department (HOSPITAL_COMMUNITY)
Admission: EM | Admit: 2016-07-02 | Discharge: 2016-07-02 | Disposition: A | Discharge status: Home / Self Care | Payer: Medicaid Other | Attending: Emergency Medicine | Admitting: Emergency Medicine

## 2016-07-02 DIAGNOSIS — Z3A22 22 weeks gestation of pregnancy: Secondary | ICD-10-CM | POA: Diagnosis not present

## 2016-07-02 DIAGNOSIS — Z79899 Other long term (current) drug therapy: Secondary | ICD-10-CM | POA: Diagnosis not present

## 2016-07-02 DIAGNOSIS — I10 Essential (primary) hypertension: Secondary | ICD-10-CM | POA: Diagnosis not present

## 2016-07-02 DIAGNOSIS — O26892 Other specified pregnancy related conditions, second trimester: Secondary | ICD-10-CM | POA: Insufficient documentation

## 2016-07-02 DIAGNOSIS — M545 Low back pain: Secondary | ICD-10-CM | POA: Diagnosis not present

## 2016-07-02 DIAGNOSIS — E119 Type 2 diabetes mellitus without complications: Secondary | ICD-10-CM | POA: Diagnosis not present

## 2016-07-02 DIAGNOSIS — Z87891 Personal history of nicotine dependence: Secondary | ICD-10-CM | POA: Diagnosis not present

## 2016-07-02 DIAGNOSIS — R109 Unspecified abdominal pain: Secondary | ICD-10-CM | POA: Insufficient documentation

## 2016-07-02 HISTORY — DX: HELLP syndrome (HELLP), unspecified trimester: O14.20

## 2016-07-02 LAB — URINALYSIS, ROUTINE W REFLEX MICROSCOPIC
Bilirubin Urine: NEGATIVE
Glucose, UA: NEGATIVE mg/dL
Hgb urine dipstick: NEGATIVE
Ketones, ur: NEGATIVE mg/dL
LEUKOCYTES UA: NEGATIVE
NITRITE: NEGATIVE
PH: 7 (ref 5.0–8.0)
Protein, ur: NEGATIVE mg/dL
SPECIFIC GRAVITY, URINE: 1.026 (ref 1.005–1.030)

## 2016-07-02 MED ORDER — ACETAMINOPHEN 325 MG PO TABS
650.0000 mg | ORAL_TABLET | Freq: Once | ORAL | Status: AC
  Administered 2016-07-02: 650 mg via ORAL
  Filled 2016-07-02: qty 2

## 2016-07-02 NOTE — ED Provider Notes (Signed)
[facility]-EMERGENCY DEPT Provider Note   CSN: [id number] Arrival date & time: 07/02/16  0133     History   Chief Complaint Chief Complaint  Patient presents with  . Back Pain    HPI Marie Green is a 31 y.o. female.  The history is provided by the patient.  Back Pain   This is a new problem. The current episode started 2 days ago. The problem occurs constantly. The problem has been gradually worsening. The pain is associated with lifting heavy objects. The pain is present in the lumbar spine. The quality of the pain is described as shooting. The pain radiates to the left thigh and right thigh. The pain is moderate. The symptoms are aggravated by certain positions. The pain is the same all the time. Associated symptoms include abdominal pain. Pertinent negatives include no chest pain, no fever, no bowel incontinence, no bladder incontinence, no dysuria and no weakness. She has tried nothing for the symptoms. Risk factors include pregnancy.  pt is [redacted] weeks pregnant by ultrasound (due date 11/06/16) Presents with low back pain that radiates into bilateral thighs as well as mild lower abdominal pain She reports recent heavy lifting then pain started No falls/trauma No fever/vomiting No vag bleeding/discharge No loss of vaginal fluid No new leg weakness No incontinence reported No h/o chronic back pain, no h/o back surgery She has had no complications thus far with this pregnancy She is noticing fetal movement  H/o HELLP syndrome from previous pregnancy  Past Medical History:  Diagnosis Date  . Anemia   . Diabetes mellitus without complication (HCC)   . HELLP syndrome   . Hypertension   . Migraines     There are no active problems to display for this patient.   History reviewed. No pertinent surgical history.  OB History    Gravida Para Term Preterm AB Living   5 3 3   1 3    SAB TAB Ectopic Multiple Live Births   1               Home Medications    Prior to  Admission medications   Medication Sig Start Date End Date Taking? Authorizing Provider  aspirin 81 MG chewable tablet Chew 81 mg by mouth daily.   Yes Historical Provider, MD  UNABLE TO FIND Med Name: unknown 'acid reflux pill"   Yes Historical Provider, MD  naproxen (NAPROSYN) 250 MG tablet Take 1 tablet (250 mg total) by mouth 2 (two) times daily as needed for mild pain or moderate pain (take with food). 11/28/15   Samuel Jester, DO  Prenatal Vit-Fe Fumarate-FA (PRENATAL ONE DAILY PO) Take 1 tablet by mouth daily.    Historical Provider, MD    Family History Family History  Problem Relation Age of Onset  . Stroke Brother   . Seizures Other   . Hypertension Other     Social History Social History  Substance Use Topics  . Smoking status: Former Games developer  . Smokeless tobacco: Never Used  . Alcohol use No     Allergies   Mushroom extract complex and Flagyl [metronidazole]   Review of Systems Review of Systems  Constitutional: Negative for fever.  Cardiovascular: Negative for chest pain.  Gastrointestinal: Positive for abdominal pain. Negative for bowel incontinence.  Genitourinary: Negative for bladder incontinence, dysuria, vaginal bleeding and vaginal discharge.  Musculoskeletal: Positive for back pain.  Neurological: Negative for weakness.  All other systems reviewed and are negative.    Physical Exam Updated Vital  Signs BP 114/80 (BP Location: Left Arm)   Pulse 94   Temp 98.8 F (37.1 C) (Oral)   Resp 18   Ht 5' 5.5" (1.664 m)   Wt 79.4 kg   LMP 01/31/2016 (Approximate)   SpO2 100%   BMI 28.68 kg/m   Physical Exam CONSTITUTIONAL: Well developed/well nourished HEAD: Normocephalic/atraumatic ENMT: Mucous membranes moist NECK: supple no meningeal signs SPINE/BACK:entire spine nontender , mild lumbar paraspinal tenderness, No bruising/crepitance/stepoffs noted to spine CV: S1/S2 noted, no murmurs/rubs/gallops noted LUNGS: Lungs are clear to auscultation  bilaterally, no apparent distress ABDOMEN: soft, nontender, no rebound or guarding, gravid GU:no cva tenderness NEURO: Awake/alert, equal motor 5/5 strength noted with the following: hip flexion/knee flexion/extension, foot dorsi/plantar flexion, great toe extension intact bilaterally, no clonus bilaterally, no sensory deficit in any dermatome.  Equal patellar/achilles reflex noted  in bilateral lower extremities.  Pt is able to ambulate  EXTREMITIES: pulses normal, full ROM SKIN: warm, color normal PSYCH: no abnormalities of mood noted, alert and oriented to situation    ED Treatments / Results  Labs (all labs ordered are listed, but only abnormal results are displayed) Labs Reviewed  URINALYSIS, ROUTINE W REFLEX MICROSCOPIC    EKG  EKG Interpretation None       Radiology No results found.  Procedures Procedures (including critical care time)  Medications Ordered in ED Medications  acetaminophen (TYLENOL) tablet 650 mg (650 mg Oral Given 07/02/16 0237)     Initial Impression / Assessment and Plan / ED Course  I have reviewed the triage vital signs and the nursing notes.  Pertinent labs results that were available during my care of the patient were reviewed by me and considered in my medical decision making (see chart for details).     I confirmed from previous studies that pt is approximately 21-22 weeks pregnancy This appears to be musculoskeletal in nature No focal neuro deficits in legs Most of her pain is located in low back 3:26 AM Pt improved No distress Vitals appropriate No UTI Will d/c home No pregnancy complications noted Advised rest, heating pad as needed and can safely use APAP for pain We discussed strict ER return precautions   Final Clinical Impressions(s) / ED Diagnoses   Final diagnoses:  Acute bilateral low back pain, with sciatica presence unspecified    New Prescriptions New Prescriptions   No medications on file     Zadie Rhineonald  Zacheriah Stumpe, MD 07/02/16 [id number]

## 2016-07-02 NOTE — ED Notes (Signed)
Spoke with Olegario MessierKathy from [employer], she stated to remove pt from fetal heart tone monitor and continue to monitor for contractions and get a FHR and document at discharge; Dr. Bebe ShaggyWickline informed; [facility]

## 2016-07-02 NOTE — ED Notes (Signed)
Pt alert & oriented x4, stable gait. Patient given discharge instructions, paperwork & prescription(s). Patient  instructed to stop at the registration desk to finish any additional paperwork. Patient verbalized understanding. Pt left department w/ no further questions. 

## 2016-07-02 NOTE — ED Notes (Signed)
Spoke with RN from [employer] and informed them that pt was placed on monitor; pt comfortable at this time

## 2016-07-02 NOTE — Discharge Instructions (Signed)

## 2016-07-02 NOTE — Progress Notes (Signed)
Notified Royetta Crochetina RN that fetal monitoring not picking up. Recommended adjusting and hand holding if needed, or removing fetal monitor and using doppler intermittently.  Will continue to review contraction monitoring. No contractions in last 15 minutes

## 2016-07-02 NOTE — ED Triage Notes (Signed)
Pt is [redacted] wks pregnant. She reports lifting several heavy objects on Thursday. Pt states ever since then she has had lower back pain, lower abdominal pain, and bilateral leg pain. Pt states that she doesn't have abdominal pain  until she moves her legs. Pt c/o constant lower back pain.

## 2016-09-22 ENCOUNTER — Inpatient Hospital Stay
Admission: EM | Admit: 2016-09-22 | Discharge: 2016-09-22 | Disposition: A | Discharge status: Home / Self Care | Payer: Medicaid Other | Attending: Obstetrics & Gynecology | Admitting: Obstetrics & Gynecology

## 2016-09-22 DIAGNOSIS — Z3A33 33 weeks gestation of pregnancy: Secondary | ICD-10-CM | POA: Diagnosis present

## 2016-09-22 DIAGNOSIS — O26893 Other specified pregnancy related conditions, third trimester: Secondary | ICD-10-CM | POA: Insufficient documentation

## 2016-09-22 DIAGNOSIS — O4700 False labor before 37 completed weeks of gestation, unspecified trimester: Secondary | ICD-10-CM

## 2016-09-22 DIAGNOSIS — O163 Unspecified maternal hypertension, third trimester: Secondary | ICD-10-CM | POA: Diagnosis present

## 2016-09-22 DIAGNOSIS — M79606 Pain in leg, unspecified: Secondary | ICD-10-CM | POA: Diagnosis not present

## 2016-09-22 LAB — URINALYSIS, COMPLETE (UACMP) WITH MICROSCOPIC
Bilirubin Urine: NEGATIVE
Glucose, UA: 50 mg/dL — AB
Hgb urine dipstick: NEGATIVE
KETONES UR: 20 mg/dL — AB
Nitrite: NEGATIVE
PROTEIN: 30 mg/dL — AB
SPECIFIC GRAVITY, URINE: 1.032 — AB (ref 1.005–1.030)
pH: 5 (ref 5.0–8.0)

## 2016-09-22 NOTE — H&P (Signed)
Obstetrics Admission History & Physical  Referring Provider: [facility] Primary OBGYN: [facility]   Chief Complaint: Abdominal pain and [place]. Felt like she was in labor.  History of Present Illness  31 y.o. [id number] @ [redacted]w[redacted]d (Dating: 7 week [place] with EDD of 11/06/16. .Pregnancy complicated by: hx of severe pre-ecclampsia and IUFD hx at 26 2/7 wks with HELLP and abruption. 2 other pregnancies with Gest HTN  With no pre-ecclampsia.   [name] presents for "Th PTL S/S" no ROM, NO VB or decreased FM.  Review of Systems:    +back pain, +hip pain and lower abd pain and pain in the upper legs from walking [place] the zoo There are no active problems to display for this patient.    PMHx:  Past Medical History:  Diagnosis Date  . Anemia   . Diabetes mellitus without complication (HCC)   . HELLP syndrome   . Hypertension   . Migraines    PSHx: History reviewed. No pertinent surgical history. Medications:  Prescriptions Prior to Admission  Medication Sig Dispense Refill Last Dose  . aspirin 81 MG chewable tablet Chew 81 mg by mouth daily.   09/22/2016 at Unknown time  . Prenatal Vit-Fe Fumarate-FA (PRENATAL ONE DAILY PO) Take 1 tablet by mouth daily.   09/22/2016 at Unknown time  . UNABLE TO FIND Med Name: unknown 'acid reflux pill"   09/22/2016 at Unknown time   Allergies: is allergic to mushroom extract complex and flagyl [metronidazole]. OBHx:  OB History  Gravida Para Term Preterm AB Living  5 3 3   1 3   SAB TAB Ectopic Multiple Live Births  1            # Outcome Date GA Lbr Len/2nd Weight Sex Delivery Anes PTL Lv  5 Current           4 SAB           3 Term           2 Term           1 Term               GYNHx:  History of abnormal pap smears: not found History of STIs:  GC in [place], 03/19/16 with tx.         FHx:  Family History  Problem Relation Age of Onset  . Stroke Brother   . Seizures Other   . Hypertension Other    Soc Hx:    Objective   Vitals:   09/22/16 1621  BP: 129/74  Pulse: 94  Resp: 18  Temp: 98.2 F (36.8 C)   Temp:  [98.2 F (36.8 C)] 98.2 F (36.8 C) (05/10 1621) Pulse Rate:  [94] 94 (05/10 1621) Resp:  [18] 18 (05/10 1621) BP: (129)/(74) 129/74 (05/10 1621) SpO2:  [100 %] 100 % (05/10 1621) Weight:  [191 lb (86.6 kg)] 191 lb (86.6 kg) (05/10 1621) Temp (24hrs), Avg:98.2 F (36.8 C), Min:98.2 F (36.8 C), Max:98.2 F (36.8 C)  No intake or output data in the 24 hours ending 09/22/16 1647   Current Vital Signs 24h Vital Sign Ranges  T 98.2 F (36.8 C) Temp  Avg: 98.2 F (36.8 C)  Min: 98.2 F (36.8 C)  Max: 98.2 F (36.8 C)  BP 129/74 BP  Min: 129/74  Max: 129/74  HR 94 Pulse  Avg: 94  Min: 94  Max: 94  RR 18 Resp  Avg: 18  Min: 18  Max: 18  SaO2 100 % Not Delivered SpO2  Avg: 100 %  Min: 100 %  Max: 100 %       24 Hour I/O Current Shift I/O  Time Ins Outs No intake/output data recorded. No intake/output data recorded.   EFM: NST reactive with 2 accels 15 x 15 BPM Toco: NO UC's.  General: Well nourished, well developed female in no acute distress.  Skin:  Warm and dry.  Cardiovascular: S1S2, RRR, No M/R/G. Respiratory:  Clear to auscultation bilateral. Normal respiratory effort. No W/R/R. Abdomen: Gravid, EFW 4#10oz Neuro/Psych:  Normal mood and affect.    SVE: 1 ext os/int os closed   Perinatal info  [facility]: Neg HIV, B pos, Antibody neg, GBS neg, HepBSag neg , GC/CH neg,   GC pos in the pregnancy but, was treated [facility] Assessment & Plan   31 y.o. [id number] @ 952w4d with signs and symptoms, with likely IUP at 33 5/7/. weeks GBS unknown   Marie Pimplearon W. Boaz Berisha, MSN, CNM, FNP [facility]

## 2016-09-22 NOTE — Discharge Summary (Signed)
Obstetric Discharge Summary   Patient ID: Marie Green MRN: [id number] DOB/AGE: 30/05/1985 31 y.o.   Date of Admission: 09/22/2016  Date of Discharge:  09/22/16 Admitting Diagnosis: Th PTL  at 4160w4d  Secondary Diagnosis: Hip, lower leg and lower back discomfort   Mode of Delivery: Pt has not delivered     Discharge Diagnosis: IUP at 33 4/7 weeks   Intrapartum Procedures: NST reactive    Post partum procedures: None  Complications: None   Brief Hospital Course  Marie Green is a [id number] who is 3133 4/7 weeks [facility] and went to the zoo today on a field trip with her child. She developed lower abd discomfort and lower back, hip and leg pain after walking around since 1100.  for further details of this delivery, please refer to the delivery note.  Patient had an uncomplicated  Visit today.   She was deemed stable for discharge to home.      Labs: CBC Latest Ref Rng & Units 11/28/2015  WBC 4.0 - 10.5 K/uL 3.4(L)  Hemoglobin 12.0 - 15.0 g/dL 11.4(L)  Hematocrit 36.0 - 46.0 % 34.1(L)  Platelets 150 - 400 K/uL 255    Physical exam:  Blood pressure 129/74, pulse 94, temperature 98.2 F (36.8 C), temperature source Oral, resp. rate 18, height 5' 5.5" (1.664 m), weight 191 lb (86.6 kg), last menstrual period 01/31/2016, SpO2 100 %. General: alert and no distress Abdomen: soft, NT Uterine Fundus: firm Extremities: No evidence of DVT seen on physical exam. No lower extremity edema.  Discharge Instructions: Per After Visit Summary. Activity: Advance as tolerated. Pelvic rest for 6 weeks.  Also refer to After Visit Summary Diet: Regular Medications:PNV [facility]  Discharged Condition:Stable   Discharged to: home    Disposition:Home    Sharee PimpleCaron W Kitai Purdom, [place] 09/22/2016

## 2016-09-22 NOTE — Discharge Instructions (Signed)
Preventing Preterm Birth °Preterm birth is when your baby is delivered between 20 weeks and 37 weeks of pregnancy. A full-term pregnancy lasts for at least 37 weeks. Preterm birth can be dangerous for your baby because the last few weeks of pregnancy are an important time for your baby's brain and lungs to grow. Many things can cause a baby to be born early. Sometimes the cause is not known. There are certain factors that make you more likely to experience preterm birth, such as: °· Having a previous baby born preterm. °· Being pregnant with twins or other multiples. °· Having had fertility treatment. °· Being overweight or underweight at the start of your pregnancy. °· Having any of the following during pregnancy: °¨ An infection, including a urinary tract infection (UTI) or an STI (sexually transmitted infection). °¨ High blood pressure. °¨ Diabetes. °¨ Vaginal bleeding. °· Being age 35 or older. °· Being age 18 or younger. °· Getting pregnant within 6 months of a previous pregnancy. °· Suffering extreme stress or physical or emotional abuse during pregnancy. °· Standing for long periods of time during pregnancy, such as working at a job that requires standing. °What are the risks? °The most serious risk of preterm birth is that the baby may not survive. This is more likely to happen if a baby is born before 34 weeks. Other risks and complications of preterm birth may include your baby having: °· Breathing problems. °· Brain damage that affects movement and coordination (cerebral palsy). °· Feeding difficulties. °· Vision or hearing problems. °· Infections or inflammation of the digestive tract (colitis). °· Developmental delays. °· Learning disabilities. °· Higher risk for diabetes, heart disease, and high blood pressure later in life. °What can I do to lower my risk? °Medical care °The most important thing you can do to lower your risk for preterm birth is to get routine medical care during pregnancy (prenatal  care). If you have a high risk of preterm birth, you may be referred to a health care provider who specializes in managing high-risk pregnancies (perinatologist). You may be given medicine to help prevent preterm birth. °Lifestyle changes °Certain lifestyle changes can also lower your risk of preterm birth: °· Wait at least 6 months after a pregnancy to become pregnant again. °· Try to plan pregnancy for when you are between 19 and 35 years old. °· Get to a healthy weight before getting pregnant. If you are overweight, work with your health care provider to safely lose weight. °· Do not use any products that contain nicotine or tobacco, such as cigarettes and e-cigarettes. If you need help quitting, ask your health care provider. °· Do not drink alcohol. °· Do not use drugs. °Where to find support: °For more support, consider: °· Talking with your health care provider. °· Talking with a therapist or substance abuse counselor, if you need help quitting. °· Working with a diet and nutrition specialist (dietitian) or a personal trainer to maintain a healthy weight. °· Joining a support group. °Where to find more information: °Learn more about preventing preterm birth from: °· Centers for Disease Control and Prevention: cdc.gov/reproductivehealth/maternalinfanthealth/pretermbirth.htm °· March of Dimes: marchofdimes.org/complications/premature-babies.aspx °· American Pregnancy Association: americanpregnancy.org/labor-and-birth/premature-labor °Contact a health care provider if: °· You have any of the following signs of preterm labor before 37 weeks: °¨ A change or increase in vaginal discharge. °¨ Fluid leaking from your vagina. °¨ Pressure or cramps in your lower abdomen. °¨ A backache that does not go away or gets worse. °¨   Regular tightening (contractions) in your lower abdomen. °Summary °· Preterm birth means having your baby during weeks 20-37 of pregnancy. °· Preterm birth may put your baby at risk for physical and  mental problems. °· Getting good prenatal care can help prevent preterm birth. °· You can lower your risk of preterm birth by making certain lifestyle changes, such as not smoking and not using alcohol. °This information is not intended to replace advice given to you by your health care provider. Make sure you discuss any questions you have with your health care provider. °Document Released: 06/16/2015 Document Revised: 01/09/2016 Document Reviewed: 01/09/2016 °Elsevier Interactive Patient Education © 2017 [employer] ° °

## 2016-09-22 NOTE — OB Triage Note (Signed)
Pt presents c/o abdominal cramping and pressure that started about 10:00 am. Denies any bleeding or LOF. States she has had a little extra vaginal discharge. [place]. Pt has ate and drank plenty of fluids today. Reports positive fetal movement.

## 2016-10-27 ENCOUNTER — Encounter: Payer: Self-pay | Admitting: *Deleted

## 2016-10-27 ENCOUNTER — Observation Stay
Admission: EM | Admit: 2016-10-27 | Discharge: 2016-10-28 | Disposition: A | Discharge status: Home / Self Care | Payer: Medicaid Other | Attending: Obstetrics and Gynecology | Admitting: Obstetrics and Gynecology

## 2016-10-27 DIAGNOSIS — R197 Diarrhea, unspecified: Secondary | ICD-10-CM | POA: Insufficient documentation

## 2016-10-27 DIAGNOSIS — Z79899 Other long term (current) drug therapy: Secondary | ICD-10-CM | POA: Insufficient documentation

## 2016-10-27 DIAGNOSIS — O99613 Diseases of the digestive system complicating pregnancy, third trimester: Secondary | ICD-10-CM | POA: Diagnosis not present

## 2016-10-27 DIAGNOSIS — O163 Unspecified maternal hypertension, third trimester: Secondary | ICD-10-CM | POA: Diagnosis not present

## 2016-10-27 DIAGNOSIS — R109 Unspecified abdominal pain: Secondary | ICD-10-CM

## 2016-10-27 DIAGNOSIS — O26899 Other specified pregnancy related conditions, unspecified trimester: Secondary | ICD-10-CM

## 2016-10-27 DIAGNOSIS — Z87891 Personal history of nicotine dependence: Secondary | ICD-10-CM | POA: Insufficient documentation

## 2016-10-27 DIAGNOSIS — Z7982 Long term (current) use of aspirin: Secondary | ICD-10-CM | POA: Diagnosis not present

## 2016-10-27 DIAGNOSIS — R110 Nausea: Secondary | ICD-10-CM | POA: Diagnosis not present

## 2016-10-27 DIAGNOSIS — O26893 Other specified pregnancy related conditions, third trimester: Secondary | ICD-10-CM | POA: Diagnosis present

## 2016-10-27 DIAGNOSIS — O24913 Unspecified diabetes mellitus in pregnancy, third trimester: Secondary | ICD-10-CM | POA: Insufficient documentation

## 2016-10-27 DIAGNOSIS — Z3A38 38 weeks gestation of pregnancy: Secondary | ICD-10-CM | POA: Diagnosis not present

## 2016-10-27 DIAGNOSIS — R252 Cramp and spasm: Secondary | ICD-10-CM | POA: Insufficient documentation

## 2016-10-27 NOTE — OB Triage Note (Signed)
Recvd pt from ED. PT c/o cramping and tightness. States she has an induction set for 6/17 dur to hx of HELLP. No intercourse in the past 24 hours. States she is hydrated. Says baby hasn't moved like usual today. No vaginal bleeding or LOF.

## 2016-10-27 NOTE — Discharge Instructions (Signed)

## 2016-10-27 NOTE — Progress Notes (Signed)
Marie SmilesSha Green is a 31 y.o. female. She is at 412w4d gestation. Patient's last menstrual period was 01/31/2016 (approximate). Estimated Date of [facility]: [facility]  Chief complaint:"cramping since 0300am"  Location: lower abd Onset/timing:started at 0300am and has been intermittant all day long Duration:intermittant Quality: carmping Severity:not severe Aggravating or alleviating conditions: None Associated signs/symptoms:+reflux, +nausea Context:N/A  S: Resting comfortably. no CTX, no VB.no LOF,  Active fetal movement. +  Maternal Medical History:   Past Medical History:  Diagnosis Date  . Anemia   . Diabetes mellitus without complication (HCC)   . HELLP syndrome   . Hypertension   . Migraines     History reviewed. No pertinent surgical history.  Allergies  Allergen Reactions  . Mushroom Extract Complex Anaphylaxis  . Flagyl [Metronidazole] Rash    Prior to Admission medications   Medication Sig Start Date End Date Taking? Authorizing Provider  aspirin 81 MG chewable tablet Chew 81 mg by mouth daily.   Yes [provider]  Prenatal Vit-Fe Fumarate-FA (PRENATAL ONE DAILY PO) Take 1 tablet by mouth daily.   Yes [provider]     Social History: She  reports that she has quit smoking. She has never used smokeless tobacco. She reports that she does not drink alcohol or use drugs.  Family History: family history includes Hypertension in her other; Seizures in her other; Stroke in her brother.  no history of gyn cancers  Review of Systems: Review of Systems  Constitutional: Negative.   HENT: Negative.   Eyes: Negative.   Respiratory: Negative.   Cardiovascular: Negative.   Gastrointestinal: Positive for heartburn and nausea.  Genitourinary: Negative.   Musculoskeletal: Negative.   Skin: Negative.   Neurological: Negative.   Endo/Heme/Allergies: Negative.   Psychiatric/Behavioral: Negative.   GYN: NO  Vag dc, no LOF or VB   O:  BP 131/84 (BP Location: Left Arm)   Pulse 90   Temp 98.7 F (37.1 C)   Resp 16   Ht 5\' 5"  (1.651 m)   Wt 196 lb (88.9 kg)   LMP 01/31/2016 (Approximate)   BMI 32.62 kg/m  No results found for this or any previous visit (from the past 48 hour(s)).   Constitutional: NAD, AAOx3  HE/ENT: extraocular movements grossly intact, moist mucous membranes CV: RRR, S1S2, No M/R/G PULM: nl respiratory effort, CTABL     Abd: gravid, EFW 8#0oz     Ext: Non-tender, Nonedmeatous   Psych: mood appropriate, speech normal Pelvic:2.5cms/vtx  NST: Reactive  Baseline: 120 Variability: moderate Accelerations present x >2 Decelerations absent Time 20mins    A/P: 31 y.o. 532w4d here for antenatal surveillance for cramping   Labor: not present.   Fetal Wellbeing: Reassuring Cat 1 tracing.  Reactive NST   D/c home stable, precautions reviewed, follow-up as scheduled.   ----- Sharee Pimplearon W. Nishita Isaacks, RN, MSN, CNM, FNP Certified Nurse Midwife [facility], Department of OB/GYN [facility]

## 2016-10-27 NOTE — Discharge Summary (Signed)
Obstetric Discharge Summary   Patient ID: Marie Green MRN: [id number] DOB/AGE: 29/05/1985 29 y.o.   Date of Admission: 10/27/2016  Date of Discharge: 10/27/16  Admitting Diagnosis: IUP at 3810w4d  Secondary Diagnosis:Cramping, Nausea, GERD, Diarrhea  Mode of Delivery: N/A     Discharge Diagnosis: IUP at 38 4/7 weeks with cramping   Intrapartum Procedures: N/A   Post partum procedures: N/A  Complications: none   Brief Hospital Course  Marie Green is a [id number] who had cramping since 0300 am and arrived this pm for a labor check due to diarrhea today, GERD, +nausea and thought she may be in labor;  for further details of this visit please refer to the Triage  note.  She was deemed stable for discharge to home.     Labs: CBC Latest Ref Rng & Units 11/28/2015  WBC 4.0 - 10.5 K/uL 3.4(L)  Hemoglobin 12.0 - 15.0 g/dL 11.4(L)  Hematocrit 36.0 - 46.0 % 34.1(L)  Platelets 150 - 400 K/uL 255    Physical exam:  Blood pressure 131/84, pulse 90, temperature 98.7 F (37.1 C), resp. rate 16, height 5\' 5"  (1.651 m), weight 196 lb (88.9 kg), last menstrual period 01/31/2016. General: alert and no distress Abdomenn: Gravid.  Extremities: No evidence of DVT seen on physical exam. No lower extremity edema.  Discharge Instructions: FU with labor S/S: contractions q 5 mins, LOF, VB or decreased FM. Activity:Up ad lib    Diet: Regular Medications:PNV, Zantac OTC  [facility] Postpartum contraception: unknown   Discharged Condition: STable   Discharged to: Home   Ileene Musaaron W Jones, CNM, MSN, FNP [facility] 10/27/2016

## 2016-10-28 DIAGNOSIS — O26893 Other specified pregnancy related conditions, third trimester: Secondary | ICD-10-CM | POA: Diagnosis not present

## 2017-02-13 DIAGNOSIS — O24419 Gestational diabetes mellitus in pregnancy, unspecified control: Secondary | ICD-10-CM

## 2017-02-13 HISTORY — DX: Gestational diabetes mellitus in pregnancy, unspecified control: O24.419

## 2017-03-20 ENCOUNTER — Encounter (HOSPITAL_COMMUNITY): Payer: Self-pay

## 2017-05-22 ENCOUNTER — Other Ambulatory Visit: Payer: Self-pay

## 2017-05-22 ENCOUNTER — Emergency Department (HOSPITAL_COMMUNITY): Payer: No Typology Code available for payment source

## 2017-05-22 ENCOUNTER — Emergency Department (HOSPITAL_COMMUNITY)
Admission: EM | Admit: 2017-05-22 | Discharge: 2017-05-22 | Disposition: A | Discharge status: Home / Self Care | Payer: No Typology Code available for payment source | Attending: Emergency Medicine | Admitting: Emergency Medicine

## 2017-05-22 ENCOUNTER — Encounter (HOSPITAL_COMMUNITY): Payer: Self-pay

## 2017-05-22 DIAGNOSIS — Z79899 Other long term (current) drug therapy: Secondary | ICD-10-CM | POA: Diagnosis not present

## 2017-05-22 DIAGNOSIS — E119 Type 2 diabetes mellitus without complications: Secondary | ICD-10-CM | POA: Insufficient documentation

## 2017-05-22 DIAGNOSIS — S161XXA Strain of muscle, fascia and tendon at neck level, initial encounter: Secondary | ICD-10-CM | POA: Diagnosis not present

## 2017-05-22 DIAGNOSIS — Y92410 Unspecified street and highway as the place of occurrence of the external cause: Secondary | ICD-10-CM | POA: Insufficient documentation

## 2017-05-22 DIAGNOSIS — Z87891 Personal history of nicotine dependence: Secondary | ICD-10-CM | POA: Diagnosis not present

## 2017-05-22 DIAGNOSIS — Y9389 Activity, other specified: Secondary | ICD-10-CM | POA: Insufficient documentation

## 2017-05-22 DIAGNOSIS — Z7982 Long term (current) use of aspirin: Secondary | ICD-10-CM | POA: Diagnosis not present

## 2017-05-22 DIAGNOSIS — I10 Essential (primary) hypertension: Secondary | ICD-10-CM | POA: Insufficient documentation

## 2017-05-22 DIAGNOSIS — Y999 Unspecified external cause status: Secondary | ICD-10-CM | POA: Insufficient documentation

## 2017-05-22 LAB — PREGNANCY, URINE: PREG TEST UR: NEGATIVE

## 2017-05-22 MED ORDER — CYCLOBENZAPRINE HCL 10 MG PO TABS
5.0000 mg | ORAL_TABLET | Freq: Once | ORAL | Status: AC
  Administered 2017-05-22: 5 mg via ORAL
  Filled 2017-05-22: qty 1

## 2017-05-22 MED ORDER — ACETAMINOPHEN 500 MG PO TABS
1000.0000 mg | ORAL_TABLET | Freq: Once | ORAL | Status: AC
  Administered 2017-05-22: 1000 mg via ORAL
  Filled 2017-05-22: qty 2

## 2017-05-22 MED ORDER — CYCLOBENZAPRINE HCL 10 MG PO TABS: 5.0000 mg | ORAL_TABLET | Freq: Three times a day (TID) | ORAL | 0 refills | Status: DC | PRN
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
## 2017-05-22 NOTE — ED Notes (Signed)
ED Provider at bedside. 

## 2017-05-22 NOTE — ED Notes (Signed)
Patient transported to X-ray 
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
## 2017-05-22 NOTE — Discharge Instructions (Signed)
Take Tylenol as directed for pain.  Take the muscle relaxer prescribed as needed for muscle spasm.  see your primary care physician if having significant pain in 4 or 5 days

## 2017-10-25 ENCOUNTER — Encounter (HOSPITAL_COMMUNITY): Payer: Self-pay | Admitting: Emergency Medicine

## 2017-10-25 ENCOUNTER — Emergency Department (HOSPITAL_COMMUNITY)
Admission: EM | Admit: 2017-10-25 | Discharge: 2017-10-25 | Discharge status: Against Medical Advice | Payer: Medicaid Other | Attending: Emergency Medicine | Admitting: Emergency Medicine

## 2017-10-25 ENCOUNTER — Other Ambulatory Visit: Payer: Self-pay

## 2017-10-25 ENCOUNTER — Emergency Department (HOSPITAL_COMMUNITY): Payer: Medicaid Other

## 2017-10-25 DIAGNOSIS — Z79899 Other long term (current) drug therapy: Secondary | ICD-10-CM | POA: Diagnosis not present

## 2017-10-25 DIAGNOSIS — E119 Type 2 diabetes mellitus without complications: Secondary | ICD-10-CM | POA: Diagnosis not present

## 2017-10-25 DIAGNOSIS — M791 Myalgia, unspecified site: Secondary | ICD-10-CM

## 2017-10-25 DIAGNOSIS — R51 Headache: Secondary | ICD-10-CM | POA: Diagnosis not present

## 2017-10-25 DIAGNOSIS — R519 Headache, unspecified: Secondary | ICD-10-CM

## 2017-10-25 DIAGNOSIS — Z7982 Long term (current) use of aspirin: Secondary | ICD-10-CM | POA: Diagnosis not present

## 2017-10-25 DIAGNOSIS — M7918 Myalgia, other site: Secondary | ICD-10-CM | POA: Diagnosis present

## 2017-10-25 LAB — URINALYSIS, ROUTINE W REFLEX MICROSCOPIC
Bilirubin Urine: NEGATIVE
Glucose, UA: NEGATIVE mg/dL
Ketones, ur: NEGATIVE mg/dL
Leukocytes, UA: NEGATIVE
NITRITE: NEGATIVE
Protein, ur: NEGATIVE mg/dL
SPECIFIC GRAVITY, URINE: 1.024 (ref 1.005–1.030)
pH: 6 (ref 5.0–8.0)

## 2017-10-25 LAB — CBC WITH DIFFERENTIAL/PLATELET
Basophils Absolute: 0 10*3/uL (ref 0.0–0.1)
Basophils Relative: 1 %
Eosinophils Absolute: 0.2 10*3/uL (ref 0.0–0.7)
Eosinophils Relative: 4 %
HEMATOCRIT: 40 % (ref 36.0–46.0)
Hemoglobin: 13.1 g/dL (ref 12.0–15.0)
LYMPHS ABS: 2.8 10*3/uL (ref 0.7–4.0)
LYMPHS PCT: 51 %
MCH: 27.8 pg (ref 26.0–34.0)
MCHC: 32.8 g/dL (ref 30.0–36.0)
MCV: 84.9 fL (ref 78.0–100.0)
Monocytes Absolute: 0.4 10*3/uL (ref 0.1–1.0)
Monocytes Relative: 7 %
NEUTROS ABS: 2 10*3/uL (ref 1.7–7.7)
NEUTROS PCT: 37 %
Platelets: 268 10*3/uL (ref 150–400)
RBC: 4.71 MIL/uL (ref 3.87–5.11)
RDW: 12.6 % (ref 11.5–15.5)
WBC: 5.4 10*3/uL (ref 4.0–10.5)

## 2017-10-25 LAB — COMPREHENSIVE METABOLIC PANEL
ALK PHOS: 72 U/L (ref 38–126)
ALT: 22 U/L (ref 14–54)
AST: 19 U/L (ref 15–41)
Albumin: 4.5 g/dL (ref 3.5–5.0)
Anion gap: 6 (ref 5–15)
BUN: 9 mg/dL (ref 6–20)
CALCIUM: 9.6 mg/dL (ref 8.9–10.3)
CO2: 27 mmol/L (ref 22–32)
CREATININE: 0.74 mg/dL (ref 0.44–1.00)
Chloride: 105 mmol/L (ref 101–111)
Glucose, Bld: 93 mg/dL (ref 65–99)
Potassium: 3.6 mmol/L (ref 3.5–5.1)
SODIUM: 138 mmol/L (ref 135–145)
Total Bilirubin: 1.5 mg/dL — ABNORMAL HIGH (ref 0.3–1.2)
Total Protein: 8.1 g/dL (ref 6.5–8.1)

## 2017-10-25 LAB — PREGNANCY, URINE: PREG TEST UR: NEGATIVE

## 2017-10-25 MED ORDER — PROCHLORPERAZINE EDISYLATE 10 MG/2ML IJ SOLN
10.0000 mg | Freq: Once | INTRAMUSCULAR | Status: AC
  Administered 2017-10-25: 10 mg via INTRAVENOUS
  Filled 2017-10-25: qty 2

## 2017-10-25 MED ORDER — SODIUM CHLORIDE 0.9 % IV BOLUS
1000.0000 mL | Freq: Once | INTRAVENOUS | Status: AC
  Administered 2017-10-25: 1000 mL via INTRAVENOUS

## 2017-10-25 MED ORDER — IBUPROFEN 800 MG PO TABS
800.0000 mg | ORAL_TABLET | Freq: Once | ORAL | Status: AC
  Administered 2017-10-25: 800 mg via ORAL
  Filled 2017-10-25: qty 1

## 2017-10-25 NOTE — ED Notes (Signed)
I went into the room to check a beeping IV line and the patient had removed her own IV and was not in the room.

## 2017-10-25 NOTE — ED Provider Notes (Addendum)
[facility] EMERGENCY DEPARTMENT Provider Note   CSN: [id number] Arrival date & time: 10/25/17  1226     History   Chief Complaint Chief Complaint  Patient presents with  . Generalized Body Aches    HPI Marie Green is a 32 y.o. female.  HPI Body aches 32 yo female co diffuse body aches , chills, subjective fever for 3 days.  Subjective fever and was taking tylenol but has not had any since yesterday.  Poor appetite, no nausea or vomiting, but has had diarrhea 3-4 loose stools per day.  Mother recently had some similar symptoms and was diagnosed with pneumonia.  g5p4 lmp ? Has implanon since last year.  HO dm but states not on meds when not pregnant.  PMD Dr. Lafonda [place]  Past Medical History:  Diagnosis Date  . Anemia   . Diabetes mellitus without complication (HCC)   . HELLP syndrome   . Hypertension   . Migraines     Patient Active Problem List   Diagnosis Date Noted  . Indication for care in labor and delivery, antepartum 10/27/2016  . Preterm labor in third trimester with term delivery, other fetus 09/22/2016    History reviewed. No pertinent surgical history.   OB History    Gravida  5   Para  3   Term  3   Preterm      AB  1   Living  3     SAB  1   TAB      Ectopic      Multiple      Live Births               Home Medications    Prior to Admission medications   Medication Sig Start Date End Date Taking? Authorizing Provider  aspirin 81 MG chewable tablet Chew 81 mg by mouth daily.    [provider]  cyclobenzaprine (FLEXERIL) 10 MG tablet Take 0.5 tablets (5 mg total) by mouth 3 (three) times daily as needed for muscle spasms. 05/22/17   Doug SouJacubowitz, Sam, MD  Prenatal Vit-Fe Fumarate-FA (PRENATAL ONE DAILY PO) Take 1 tablet by mouth daily.    [provider]    Family History Family History  Problem Relation Age of Onset  . Stroke Brother   . Seizures Other   . Hypertension Other     Social  History Social History   Tobacco Use  . Smoking status: Former Games developermoker  . Smokeless tobacco: Never Used  Substance Use Topics  . Alcohol use: Yes    Comment: occ  . Drug use: No     Allergies   Mushroom extract complex and Flagyl [metronidazole]   Review of Systems Review of Systems  All other systems reviewed and are negative.    Physical Exam Updated Vital Signs BP (!) 122/95 (BP Location: Right Arm)   Pulse 95   Temp 98.3 F (36.8 C) (Oral)   Resp 17   LMP  (LMP Unknown)   SpO2 97%   Physical Exam  Constitutional: She is oriented to person, place, and time. She appears well-developed and well-nourished.  HENT:  Head: Normocephalic and atraumatic.  Right Ear: External ear normal.  Left Ear: External ear normal.  Nose: Nose normal.  Mouth/Throat: Oropharynx is clear and moist.  Eyes: Pupils are equal, round, and reactive to light. Conjunctivae and EOM are normal.  Neck: Normal range of motion. Neck supple. No JVD present. No tracheal deviation present. No thyromegaly present.  Cardiovascular: Normal rate, regular rhythm, normal heart sounds and intact distal pulses.  Pulmonary/Chest: Effort normal and breath sounds normal. She has no wheezes.  Abdominal: Soft. Bowel sounds are normal. She exhibits no mass. There is no tenderness. There is no guarding.  Musculoskeletal: Normal range of motion.  Lymphadenopathy:    She has no cervical adenopathy.  Neurological: She is alert and oriented to person, place, and time. She has normal reflexes. No cranial nerve deficit or sensory deficit. Gait normal. GCS eye subscore is 4. GCS verbal subscore is 5. GCS motor subscore is 6. She displays no Babinski's sign on the right side. She displays no Babinski's sign on the left side.  Reflex Scores:      Bicep reflexes are 2+ on the right side and 2+ on the left side.      Patellar reflexes are 2+ on the right side and 2+ on the left side. Strength is normal and equal  throughout. Cranial nerves grossly intact. Patient fluent. No gross ataxia and patient able to ambulate without difficulty.  Skin: Skin is warm and dry.  Psychiatric: She has a normal mood and affect. Her behavior is normal. Judgment and thought content normal.  Nursing note and vitals reviewed.    ED Treatments / Results  Labs (all labs ordered are listed, but only abnormal results are displayed) Labs Reviewed  COMPREHENSIVE METABOLIC PANEL - Abnormal; Notable for the following components:      Result Value   Total Bilirubin 1.5 (*)    All other components within normal limits  CBC WITH DIFFERENTIAL/PLATELET  URINALYSIS, ROUTINE W REFLEX MICROSCOPIC  PREGNANCY, URINE    EKG None  Radiology Dg Chest 2 View  Result Date: 10/25/2017 CLINICAL DATA:  Cough EXAM: CHEST - 2 VIEW COMPARISON:  05/22/2017 FINDINGS: The heart size and mediastinal contours are within normal limits. Both lungs are clear. The visualized skeletal structures are unremarkable. IMPRESSION: No active cardiopulmonary disease. Electronically Signed   By: Alcide Clever M.D.   On: 10/25/2017 13:14    Procedures Procedures (including critical care time)  Medications Ordered in ED Medications - No data to display   Initial Impression / Assessment and Plan / ED Course  I have reviewed the triage vital signs and the nursing notes.  Pertinent labs & imaging results that were available during my care of the patient were reviewed by me and considered in my medical decision making (see chart for details).    Patient with normal physical exam, cxr, and labs except bili at 1.5 Patient reevaluated prior to iv fluids and compazine initiated and reviewed all labs and x-Lebaron Bautch Migraine ha treated with iv compazine and iv fluids- Patient left ama without informing staff   Final Clinical Impressions(s) / ED Diagnoses   Final diagnoses:  Nonintractable headache, unspecified chronicity pattern, unspecified headache type   Myalgia    ED Discharge Orders    None       Margarita Grizzle, MD 10/25/17 1914    Margarita Grizzle, MD 11/06/17 1413

## 2017-10-25 NOTE — ED Triage Notes (Addendum)
Pt c/o all over joint pain with feeling faint. C/o diarrhea. Sx x 4 days. Pt c/o green prod cough

## 2018-03-12 ENCOUNTER — Ambulatory Visit: Payer: Medicaid Other | Admitting: Neurology

## 2018-03-12 ENCOUNTER — Telehealth: Payer: Self-pay | Admitting: *Deleted

## 2018-03-12 NOTE — Telephone Encounter (Signed)
Pt no showed new pt appt on 03/12/2018 @ 09:00.

## 2018-03-13 ENCOUNTER — Encounter: Payer: Self-pay | Admitting: Neurology

## 2018-11-01 NOTE — Progress Notes (Signed)
PT is present today for confirmation of pregnancy. Pt LMP 09/27/18. UPT done today results were positive. Pt stated that she is doing well no complaints.

## 2018-11-01 NOTE — Patient Instructions (Signed)
Commonly Asked Questions During Pregnancy  Cats: A parasite can be excreted in cat feces.  To avoid exposure you need to have another person empty the little box.  If you must empty the litter box you will need to wear gloves.  Wash your hands after handling your cat.  This parasite can also be found in raw or undercooked meat so this should also be avoided.  Colds, Sore Throats, Flu: Please check your medication sheet to see what you can take for symptoms.  If your symptoms are unrelieved by these medications please call the office.  Dental Work: Most any dental work your dentist recommends is permitted.  X-rays should only be taken during the first trimester if absolutely necessary.  Your abdomen should be shielded with a lead apron during all x-rays.  Please notify your provider prior to receiving any x-rays.  Novocaine is fine; gas is not recommended.  If your dentist requires a note from us prior to dental work please call the office and we will provide one for you.  Exercise: Exercise is an important part of staying healthy during your pregnancy.  You may continue most exercises you were accustomed to prior to pregnancy.  Later in your pregnancy you will most likely notice you have difficulty with activities requiring balance like riding a bicycle.  It is important that you listen to your body and avoid activities that put you at a higher risk of falling.  Adequate rest and staying well hydrated are a must!  If you have questions about the safety of specific activities ask your provider.    Exposure to Children with illness: Try to avoid obvious exposure; report any symptoms to us when noted,  If you have chicken pos, red measles or mumps, you should be immune to these diseases.   Please do not take any vaccines while pregnant unless you have checked with your OB provider.  Fetal Movement: After 28 weeks we recommend you do "kick counts" twice daily.  Lie or sit down in a calm quiet environment and  count your baby movements "kicks".  You should feel your baby at least 10 times per hour.  If you have not felt 10 kicks within the first hour get up, walk around and have something sweet to eat or drink then repeat for an additional hour.  If count remains less than 10 per hour notify your provider.  Fumigating: Follow your pest control agent's advice as to how long to stay out of your home.  Ventilate the area well before re-entering.  Hemorrhoids:   Most over-the-counter preparations can be used during pregnancy.  Check your medication to see what is safe to use.  It is important to use a stool softener or fiber in your diet and to drink lots of liquids.  If hemorrhoids seem to be getting worse please call the office.   Hot Tubs:  Hot tubs Jacuzzis and saunas are not recommended while pregnant.  These increase your internal body temperature and should be avoided.  Intercourse:  Sexual intercourse is safe during pregnancy as long as you are comfortable, unless otherwise advised by your provider.  Spotting may occur after intercourse; report any bright red bleeding that is heavier than spotting.  Labor:  If you know that you are in labor, please go to the hospital.  If you are unsure, please call the office and let us help you decide what to do.  Lifting, straining, etc:  If your job requires heavy   lifting or straining please check with your provider for any limitations.  Generally, you should not lift items heavier than that you can lift simply with your hands and arms (no back muscles)  Painting:  Paint fumes do not harm your pregnancy, but may make you ill and should be avoided if possible.  Latex or water based paints have less odor than oils.  Use adequate ventilation while painting.  Permanents & Hair Color:  Chemicals in hair dyes are not recommended as they cause increase hair dryness which can increase hair loss during pregnancy.  " Highlighting" and permanents are allowed.  Dye may be  absorbed differently and permanents may not hold as well during pregnancy.  Sunbathing:  Use a sunscreen, as skin burns easily during pregnancy.  Drink plenty of fluids; avoid over heating.  Tanning Beds:  Because their possible side effects are still unknown, tanning beds are not recommended.  Ultrasound Scans:  Routine ultrasounds are performed at approximately 20 weeks.  You will be able to see your baby's general anatomy an if you would like to know the gender this can usually be determined as well.  If it is questionable when you conceived you may also receive an ultrasound early in your pregnancy for dating purposes.  Otherwise ultrasound exams are not routinely performed unless there is a medical necessity.  Although you can request a scan we ask that you pay for it when conducted because insurance does not cover " patient request" scans.  Work: If your pregnancy proceeds without complications you may work until your due date, unless your physician or employer advises otherwise.  Round Ligament Pain/Pelvic Discomfort:  Sharp, shooting pains not associated with bleeding are fairly common, usually occurring in the second trimester of pregnancy.  They tend to be worse when standing up or when you remain standing for long periods of time.  These are the result of pressure of certain pelvic ligaments called "round ligaments".  Rest, Tylenol and heat seem to be the most effective relief.  As the womb and fetus grow, they rise out of the pelvis and the discomfort improves.  Please notify the office if your pain seems different than that described.  It may represent a more serious condition.  Common Medications Safe in Pregnancy  Acne:      Constipation:  Benzoyl Peroxide     Colace  Clindamycin      Dulcolax Suppository  Topica Erythromycin     Fibercon  Salicylic Acid      Metamucil         Miralax AVOID:        Senakot   Accutane    Cough:  Retin-A       Cough  Drops  Tetracycline      Phenergan w/ Codeine if Rx  Minocycline      Robitussin (Plain & DM)  Antibiotics:     Crabs/Lice:  Ceclor       RID  Cephalosporins    AVOID:  E-Mycins      Kwell  Keflex  Macrobid/Macrodantin   Diarrhea:  Penicillin      Kao-Pectate  Zithromax      Imodium AD         PUSH FLUIDS AVOID:       Cipro     Fever:  Tetracycline      Tylenol (Regular or Extra  Minocycline       Strength)  Levaquin      Extra Strength-Do not  Exceed 8 tabs/24 hrs Caffeine:        <200mg/day (equiv. To 1 cup of coffee or  approx. 3 12 oz sodas)         Gas: Cold/Hayfever:       Gas-X  Benadryl      Mylicon  Claritin       Phazyme  **Claritin-D        Chlor-Trimeton    Headaches:  Dimetapp      ASA-Free Excedrin  Drixoral-Non-Drowsy     Cold Compress  Mucinex (Guaifenasin)     Tylenol (Regular or Extra  Sudafed/Sudafed-12 Hour     Strength)  **Sudafed PE Pseudoephedrine   Tylenol Cold & Sinus     Vicks Vapor Rub  Zyrtec  **AVOID if Problems With Blood Pressure         Heartburn: Avoid lying down for at least 1 hour after meals  Aciphex      Maalox     Rash:  Milk of Magnesia     Benadryl    Mylanta       1% Hydrocortisone Cream  Pepcid  Pepcid Complete   Sleep Aids:  Prevacid      Ambien   Prilosec       Benadryl  Rolaids       Chamomile Tea  Tums (Limit 4/day)     Unisom  Zantac       Tylenol PM         Warm milk-add vanilla or  Hemorrhoids:       Sugar for taste  Anusol/Anusol H.C.  (RX: Analapram 2.5%)  Sugar Substitutes:  Hydrocortisone OTC     Ok in moderation  Preparation H      Tucks        Vaseline lotion applied to tissue with wiping    Herpes:     Throat:  Acyclovir      Oragel  Famvir  Valtrex     Vaccines:         Flu Shot Leg Cramps:       *Gardasil  Benadryl      Hepatitis A         Hepatitis B Nasal Spray:       Pneumovax  Saline Nasal Spray     Polio Booster         Tetanus Nausea:       Tuberculosis test or  PPD  Vitamin B6 25 mg TID   AVOID:    Dramamine      *Gardasil  Emetrol       Live Poliovirus  Ginger Root 250 mg QID    MMR (measles, mumps &  High Complex Carbs @ Bedtime    rebella)  Sea Bands-Accupressure    Varicella (Chickenpox)  Unisom 1/2 tab TID     *No known complications           If received before Pain:         Known pregnancy;   Darvocet       Resume series after  Lortab        Delivery  Percocet    Yeast:   Tramadol      Femstat  Tylenol 3      Gyne-lotrimin  Ultram       Monistat  Vicodin           MISC:         All Sunscreens             Hair Coloring/highlights          Insect Repellant's          (Including DEET)         Mystic Tans  

## 2018-11-02 ENCOUNTER — Encounter: Payer: Self-pay | Admitting: Obstetrics and Gynecology

## 2018-11-02 ENCOUNTER — Ambulatory Visit (INDEPENDENT_AMBULATORY_CARE_PROVIDER_SITE_OTHER): Payer: Medicaid Other | Admitting: Obstetrics and Gynecology

## 2018-11-02 ENCOUNTER — Other Ambulatory Visit: Payer: Self-pay

## 2018-11-02 VITALS — BP 139/95 | HR 85 | Ht 65.5 in | Wt 174.7 lb

## 2018-11-02 DIAGNOSIS — N926 Irregular menstruation, unspecified: Secondary | ICD-10-CM

## 2018-11-02 LAB — POCT URINE PREGNANCY: Preg Test, Ur: POSITIVE — AB
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
## 2018-12-06 ENCOUNTER — Encounter: Payer: Self-pay | Admitting: Obstetrics and Gynecology

## 2018-12-06 ENCOUNTER — Encounter: Payer: Medicaid Other | Admitting: Obstetrics and Gynecology

## 2018-12-06 ENCOUNTER — Other Ambulatory Visit: Payer: Self-pay

## 2018-12-06 ENCOUNTER — Ambulatory Visit (INDEPENDENT_AMBULATORY_CARE_PROVIDER_SITE_OTHER): Payer: Medicaid Other | Admitting: Obstetrics and Gynecology

## 2018-12-06 VITALS — BP 111/73 | HR 68 | Wt 170.8 lb

## 2018-12-06 DIAGNOSIS — Z3490 Encounter for supervision of normal pregnancy, unspecified, unspecified trimester: Secondary | ICD-10-CM

## 2018-12-06 LAB — OB RESULTS CONSOLE VARICELLA ZOSTER ANTIBODY, IGG: Varicella: IMMUNE

## 2018-12-06 NOTE — Progress Notes (Signed)
Mallissa Daquila presents for [facility] nurse interview visit. Pregnancy confirmation done 11/02/2018. G6. [id number] Pregnancy education material explained and given.  cats in home. NOB labs ordered. TSH/HbgA1c ordered due to BMI 30 or greater. Sickle cell ordered due to patient's race. HIV labs and drug screen were explained and ordered. PNV encouraged. Genetic screening options discussed. Genetic testing: Ordered/Declined/Unsure. Patient may discuss with the provider. Patient to follow up with provider in 2 weeks for NOB physical. All questions answered.

## 2018-12-07 LAB — ABO AND RH: Rh Factor: POSITIVE

## 2018-12-07 LAB — HGB SOLU + RFLX FRAC: Sickle Solubility Test - HGBRFX: NEGATIVE

## 2018-12-07 LAB — URINALYSIS, ROUTINE W REFLEX MICROSCOPIC
Bilirubin, UA: NEGATIVE
Glucose, UA: NEGATIVE
Ketones, UA: NEGATIVE
Leukocytes,UA: NEGATIVE
Nitrite, UA: NEGATIVE
Protein,UA: NEGATIVE
RBC, UA: NEGATIVE
Specific Gravity, UA: >=1.03 — AB (ref 1.005–1.030)
Urobilinogen, Ur: 1 mg/dL (ref 0.2–1.0)
pH, UA: 6 (ref 5.0–7.5)

## 2018-12-07 LAB — VARICELLA ZOSTER ANTIBODY, IGG: Varicella zoster IgG: 716 index (ref >165)

## 2018-12-07 LAB — HIV ANTIBODY (ROUTINE TESTING W REFLEX): HIV Screen 4th Generation wRfx: NONREACTIVE

## 2018-12-07 LAB — RPR: RPR Ser Ql: NONREACTIVE

## 2018-12-07 LAB — HEPATITIS B SURFACE ANTIGEN: Hepatitis B Surface Ag: NEGATIVE

## 2018-12-07 LAB — ANTIBODY SCREEN: Antibody Screen: NEGATIVE

## 2018-12-07 LAB — RUBELLA SCREEN: Rubella Antibodies, IGG: 4.66 index (ref >0.99)

## 2018-12-09 LAB — CULTURE, OB URINE

## 2018-12-09 LAB — URINE CULTURE, OB REFLEX

## 2018-12-10 LAB — CANNABINOID (GC/MS), URINE
Cannabinoid: POSITIVE — AB
Carboxy THC (GC/MS): 347 ng/mL

## 2018-12-10 LAB — MONITOR DRUG PROFILE 14(MW)
Amphetamine Scrn, Ur: NEGATIVE ng/mL
BARBITURATE SCREEN URINE: NEGATIVE ng/mL
BENZODIAZEPINE SCREEN, URINE: NEGATIVE ng/mL
Buprenorphine, Urine: NEGATIVE ng/mL
Cocaine (Metab) Scrn, Ur: NEGATIVE ng/mL
Creatinine(Crt), U: 266 mg/dL (ref 20.0–300.0)
Fentanyl, Urine: NEGATIVE pg/mL
Meperidine Screen, Urine: NEGATIVE ng/mL
Methadone Screen, Urine: NEGATIVE ng/mL
OXYCODONE+OXYMORPHONE UR QL SCN: NEGATIVE ng/mL
Opiate Scrn, Ur: NEGATIVE ng/mL
Ph of Urine: 6.2 (ref 4.5–8.9)
Phencyclidine Qn, Ur: NEGATIVE ng/mL
Propoxyphene Scrn, Ur: NEGATIVE ng/mL
SPECIFIC GRAVITY: 1.028
Tramadol Screen, Urine: NEGATIVE ng/mL

## 2018-12-12 ENCOUNTER — Other Ambulatory Visit: Payer: Self-pay

## 2018-12-12 ENCOUNTER — Ambulatory Visit (INDEPENDENT_AMBULATORY_CARE_PROVIDER_SITE_OTHER): Payer: Medicaid Other

## 2018-12-12 DIAGNOSIS — Z3687 Encounter for antenatal screening for uncertain dates: Secondary | ICD-10-CM | POA: Diagnosis not present

## 2018-12-12 DIAGNOSIS — N926 Irregular menstruation, unspecified: Secondary | ICD-10-CM

## 2018-12-12 LAB — GC/CHLAMYDIA PROBE AMP
Chlamydia trachomatis, NAA: NEGATIVE
Neisseria Gonorrhoeae by PCR: NEGATIVE

## 2018-12-27 ENCOUNTER — Encounter: Payer: Self-pay | Admitting: Obstetrics and Gynecology

## 2018-12-27 ENCOUNTER — Other Ambulatory Visit: Payer: Self-pay

## 2018-12-27 ENCOUNTER — Ambulatory Visit (INDEPENDENT_AMBULATORY_CARE_PROVIDER_SITE_OTHER): Payer: Medicaid Other | Admitting: Obstetrics and Gynecology

## 2018-12-27 VITALS — BP 125/82 | HR 75 | Ht 65.5 in | Wt 167.8 lb

## 2018-12-27 DIAGNOSIS — Z3490 Encounter for supervision of normal pregnancy, unspecified, unspecified trimester: Secondary | ICD-10-CM | POA: Diagnosis not present

## 2018-12-27 DIAGNOSIS — Z3A14 14 weeks gestation of pregnancy: Secondary | ICD-10-CM | POA: Diagnosis not present

## 2018-12-27 LAB — POCT URINALYSIS DIPSTICK OB
Bilirubin, UA: NEGATIVE
Blood, UA: NEGATIVE
Glucose, UA: NEGATIVE
Ketones, UA: NEGATIVE
Leukocytes, UA: NEGATIVE
Nitrite, UA: NEGATIVE
Spec Grav, UA: 1.01 (ref 1.010–1.025)
Urobilinogen, UA: 0.2 E.U./dL
pH, UA: 6.5 (ref 5.0–8.0)

## 2018-12-27 NOTE — Progress Notes (Signed)
NOB:   Patient with complaint of persistent daily nausea without vomiting.  She says nothing tastes right.  Also complains of constipation.  She states that he prenatal vitamins with stool softeners helped her last pregnancy and would like to try them again.  She desires genetic testing.  Physical examination General NAD, Conversant  HEENT Atraumatic; Op clear with mmm.  Normo-cephalic. Pupils reactive. Anicteric sclerae  Thyroid/Neck Smooth without nodularity or enlargement. Normal ROM.  Neck Supple.  Skin No rashes, lesions or ulceration. Normal palpated skin turgor. No nodularity.  Breasts: No masses or discharge.  Symmetric.  No axillary adenopathy.  Lungs: Clear to auscultation.No rales or wheezes. Normal Respiratory effort, no retractions.  Heart: NSR.  No murmurs or rubs appreciated. No periferal edema  Abdomen: Soft.  Non-tender.  No masses.  No HSM. No hernia  Extremities: Moves all appropriately.  Normal ROM for age. No lymphadenopathy.  Neuro: Oriented to PPT.  Normal mood. Normal affect.     Pelvic:   Vulva: Normal appearance.  No lesions.  Vagina: No lesions or abnormalities noted.  Support: Normal pelvic support.  Urethra No masses tenderness or scarring.  Meatus Normal size without lesions or prolapse.  Cervix: Normal appearance.  No lesions.  Anus: Normal exam.  No lesions.  Perineum: Normal exam.  No lesions.        Bimanual   Adnexae: No masses.  Non-tender to palpation.  Uterus: Enlarged. 14wks  POS FHTs  Non-tender.  Mobile.  AV.  Adnexae: No masses.  Non-tender to palpation.  Cul-de-sac: Negative for abnormality.  Adnexae: No masses.  Non-tender to palpation.         Pelvimetry   Diagonal: Reached.  Spines: Average.  Sacrum: Concave.  Pubic Arch: Normal.   Tested pelvis  P:  1.  Genetic testing today      2.  [place] for persistent nausea      3.  Discussed prenatal vitamins with stool softener-Citrucel, Metamucil.      4.  Plan early 1 hour GCT at 23  weeks because of previous gestational diabetes      5.  Patient to begin 81 mg ASA -history of pregnancy-induced hypertension

## 2018-12-27 NOTE — Progress Notes (Signed)
Patient comes in today for new OB physical. She is having nausea and constipation. Patient stated that she had PAP in 2018 that was normal. She would like genetic testing done.

## 2018-12-31 LAB — MATERNIT21  PLUS CORE+ESS+SCA, BLOOD

## 2019-01-24 ENCOUNTER — Ambulatory Visit (INDEPENDENT_AMBULATORY_CARE_PROVIDER_SITE_OTHER): Payer: Medicaid Other | Admitting: Obstetrics and Gynecology

## 2019-01-24 ENCOUNTER — Other Ambulatory Visit: Payer: Self-pay

## 2019-01-24 ENCOUNTER — Encounter: Payer: Self-pay | Admitting: Surgical

## 2019-01-24 VITALS — BP 123/84 | HR 70 | Ht 65.5 in | Wt 171.9 lb

## 2019-01-24 DIAGNOSIS — O09292 Supervision of pregnancy with other poor reproductive or obstetric history, second trimester: Secondary | ICD-10-CM

## 2019-01-24 DIAGNOSIS — Z3A18 18 weeks gestation of pregnancy: Secondary | ICD-10-CM

## 2019-01-24 DIAGNOSIS — Z3490 Encounter for supervision of normal pregnancy, unspecified, unspecified trimester: Secondary | ICD-10-CM

## 2019-01-24 DIAGNOSIS — Z8632 Personal history of gestational diabetes: Secondary | ICD-10-CM

## 2019-01-24 LAB — POCT URINALYSIS DIPSTICK OB
Bilirubin, UA: NEGATIVE
Blood, UA: NEGATIVE
Glucose, UA: NEGATIVE
Ketones, UA: NEGATIVE
Leukocytes, UA: NEGATIVE
Nitrite, UA: NEGATIVE
Spec Grav, UA: 1.01 (ref 1.010–1.025)
Urobilinogen, UA: 0.2 E.U./dL
pH, UA: 6.5 (ref 5.0–8.0)

## 2019-01-24 MED ORDER — DOXYLAMINE-PYRIDOXINE 10-10 MG PO TBEC: 2.0000 | DELAYED_RELEASE_TABLET | Freq: Every day | ORAL | 5 refills | Status: DC

## 2019-01-24 MED ORDER — PRENATE PIXIE 10-0.6-0.4-200 MG PO CAPS: 1.0000 | ORAL_CAPSULE | Freq: Every day | ORAL | 11 refills | Status: DC

## 2019-01-24 NOTE — Progress Notes (Signed)
ROB: Patient states she has to do a lot of heavy lifting at work-note given.  Taking aspirin as directed.  1 hour GCT next week.  Patient declined AFP for spina bifida.  FAS ultrasound 4 weeks.  Patient very excited it is a girl!

## 2019-01-24 NOTE — Addendum Note (Signed)
Addended by: Durwin Glaze on: 01/24/2019 09:10 AM   Modules accepted: Orders

## 2019-01-24 NOTE — Progress Notes (Signed)
Patient comes in today for [place] visit. 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

## 2019-01-31 ENCOUNTER — Other Ambulatory Visit: Payer: Medicaid Other

## 2019-02-21 ENCOUNTER — Other Ambulatory Visit: Payer: Self-pay

## 2019-02-21 ENCOUNTER — Encounter: Payer: Self-pay | Admitting: Obstetrics and Gynecology

## 2019-02-21 ENCOUNTER — Ambulatory Visit (INDEPENDENT_AMBULATORY_CARE_PROVIDER_SITE_OTHER): Payer: Medicaid Other | Admitting: Obstetrics and Gynecology

## 2019-02-21 ENCOUNTER — Ambulatory Visit (INDEPENDENT_AMBULATORY_CARE_PROVIDER_SITE_OTHER): Payer: Medicaid Other

## 2019-02-21 ENCOUNTER — Other Ambulatory Visit: Payer: Medicaid Other

## 2019-02-21 VITALS — BP 118/78 | HR 77 | Wt 179.3 lb

## 2019-02-21 DIAGNOSIS — O09292 Supervision of pregnancy with other poor reproductive or obstetric history, second trimester: Secondary | ICD-10-CM

## 2019-02-21 DIAGNOSIS — Z363 Encounter for antenatal screening for malformations: Secondary | ICD-10-CM | POA: Diagnosis not present

## 2019-02-21 DIAGNOSIS — O09299 Supervision of pregnancy with other poor reproductive or obstetric history, unspecified trimester: Secondary | ICD-10-CM

## 2019-02-21 DIAGNOSIS — O99891 Other specified diseases and conditions complicating pregnancy: Secondary | ICD-10-CM

## 2019-02-21 DIAGNOSIS — Z3490 Encounter for supervision of normal pregnancy, unspecified, unspecified trimester: Secondary | ICD-10-CM

## 2019-02-21 DIAGNOSIS — Z3482 Encounter for supervision of other normal pregnancy, second trimester: Secondary | ICD-10-CM

## 2019-02-21 DIAGNOSIS — M549 Dorsalgia, unspecified: Secondary | ICD-10-CM

## 2019-02-21 DIAGNOSIS — Z3A22 22 weeks gestation of pregnancy: Secondary | ICD-10-CM

## 2019-02-21 DIAGNOSIS — O99892 Other specified diseases and conditions complicating childbirth: Secondary | ICD-10-CM

## 2019-02-21 DIAGNOSIS — Z8632 Personal history of gestational diabetes: Secondary | ICD-10-CM

## 2019-02-21 LAB — POCT URINALYSIS DIPSTICK OB
Bilirubin, UA: NEGATIVE
Blood, UA: NEGATIVE
Glucose, UA: NEGATIVE
Ketones, UA: NEGATIVE
Leukocytes, UA: NEGATIVE
Nitrite, UA: NEGATIVE
POC,PROTEIN,UA: NEGATIVE
Spec Grav, UA: 1.025 (ref 1.010–1.025)
Urobilinogen, UA: 0.2 E.U./dL
pH, UA: 6.5 (ref 5.0–8.0)

## 2019-02-21 NOTE — Progress Notes (Signed)
ROB: Patient notes she is noting some back pain while working. 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 Will give more detailed notice, and if they will allow patient to be out while holding her job for her, can write new letter for patient to be out of work. Also discussed Tylenol, supportive shoes, and pregnancy brace. Normal anatomy scan.  Early glucola done today for h/o GDM in prior pregnancy. RTC in 4 weeks.

## 2019-02-21 NOTE — Progress Notes (Signed)
ROB-Pt present for routine prenatal care. Pt stated having lower back pains. No other problems.

## 2019-02-21 NOTE — Patient Instructions (Signed)

## 2019-02-22 LAB — CBC
Hematocrit: 32.2 % — ABNORMAL LOW (ref 34.0–46.6)
Hemoglobin: 10.8 g/dL — ABNORMAL LOW (ref 11.1–15.9)
MCH: 29 pg (ref 26.6–33.0)
MCHC: 33.5 g/dL (ref 31.5–35.7)
MCV: 86 fL (ref 79–97)
Platelets: 221 10*3/uL (ref 150–450)
RBC: 3.73 x10E6/uL — ABNORMAL LOW (ref 3.77–5.28)
RDW: 12.5 % (ref 11.7–15.4)
WBC: 7.9 10*3/uL (ref 3.4–10.8)

## 2019-02-22 LAB — GLUCOSE, 1 HOUR GESTATIONAL: Gestational Diabetes Screen: 157 mg/dL — ABNORMAL HIGH (ref 65–139)

## 2019-02-22 LAB — RPR: RPR Ser Ql: NONREACTIVE

## 2019-03-05 ENCOUNTER — Other Ambulatory Visit: Payer: Self-pay

## 2019-03-05 ENCOUNTER — Other Ambulatory Visit: Payer: Medicaid Other

## 2019-03-05 DIAGNOSIS — Z3482 Encounter for supervision of other normal pregnancy, second trimester: Secondary | ICD-10-CM

## 2019-03-06 LAB — GESTATIONAL GLUCOSE TOLERANCE
Glucose, Fasting: 83 mg/dL (ref 65–94)
Glucose, GTT - 1 Hour: 185 mg/dL — ABNORMAL HIGH (ref 65–179)
Glucose, GTT - 2 Hour: 132 mg/dL (ref 65–154)
Glucose, GTT - 3 Hour: 105 mg/dL (ref 65–139)

## 2019-03-08 ENCOUNTER — Encounter: Payer: Self-pay | Admitting: *Deleted

## 2019-03-08 ENCOUNTER — Inpatient Hospital Stay: Payer: Medicaid Other

## 2019-03-08 ENCOUNTER — Other Ambulatory Visit: Payer: Self-pay

## 2019-03-08 ENCOUNTER — Observation Stay
Admission: EM | Admit: 2019-03-08 | Discharge: 2019-03-09 | Disposition: A | Discharge status: Home / Self Care | Payer: Medicaid Other | Attending: Obstetrics and Gynecology | Admitting: Obstetrics and Gynecology

## 2019-03-08 DIAGNOSIS — Z3A22 22 weeks gestation of pregnancy: Secondary | ICD-10-CM | POA: Insufficient documentation

## 2019-03-08 DIAGNOSIS — O9A212 Injury, poisoning and certain other consequences of external causes complicating pregnancy, second trimester: Secondary | ICD-10-CM | POA: Diagnosis present

## 2019-03-08 DIAGNOSIS — O4590 Premature separation of placenta, unspecified, unspecified trimester: Secondary | ICD-10-CM
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
## 2019-03-09 DIAGNOSIS — O9A212 Injury, poisoning and certain other consequences of external causes complicating pregnancy, second trimester: Secondary | ICD-10-CM | POA: Diagnosis not present

## 2019-03-09 LAB — RUPTURE OF MEMBRANE (ROM)PLUS: Rom Plus: NEGATIVE

## 2019-03-09 NOTE — Progress Notes (Signed)
Discharge instructions given to pt. Pt has no questions and verbalizes understanding. Pt discharged home.

## 2019-03-09 NOTE — Discharge Instructions (Signed)
Contact OB if abdominal cramping returns. Go to next scheduled appointment.

## 2019-03-21 ENCOUNTER — Other Ambulatory Visit: Payer: Self-pay

## 2019-03-21 ENCOUNTER — Ambulatory Visit (INDEPENDENT_AMBULATORY_CARE_PROVIDER_SITE_OTHER): Payer: Medicaid Other | Admitting: Obstetrics and Gynecology

## 2019-03-21 ENCOUNTER — Encounter: Payer: Self-pay | Admitting: Obstetrics and Gynecology

## 2019-03-21 VITALS — BP 110/75 | HR 64 | Ht 65.5 in | Wt 182.0 lb

## 2019-03-21 DIAGNOSIS — Z3A25 25 weeks gestation of pregnancy: Secondary | ICD-10-CM

## 2019-03-21 DIAGNOSIS — O26892 Other specified pregnancy related conditions, second trimester: Secondary | ICD-10-CM

## 2019-03-21 DIAGNOSIS — Z3482 Encounter for supervision of other normal pregnancy, second trimester: Secondary | ICD-10-CM

## 2019-03-21 DIAGNOSIS — N898 Other specified noninflammatory disorders of vagina: Secondary | ICD-10-CM

## 2019-03-21 NOTE — Progress Notes (Signed)
Patient comes in today for ROB visit. No concerns today. 
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
## 2019-03-25 ENCOUNTER — Other Ambulatory Visit: Payer: Self-pay | Admitting: Surgical

## 2019-03-25 MED ORDER — CLINDAMYCIN HCL 300 MG PO CAPS: 300.0000 mg | ORAL_CAPSULE | Freq: Two times a day (BID) | ORAL | 0 refills | Status: DC

## 2019-03-25 NOTE — Progress Notes (Signed)
.  clin

## 2019-04-05 ENCOUNTER — Other Ambulatory Visit: Payer: Self-pay | Admitting: Surgical

## 2019-04-05 DIAGNOSIS — O09299 Supervision of pregnancy with other poor reproductive or obstetric history, unspecified trimester: Secondary | ICD-10-CM

## 2019-04-05 DIAGNOSIS — Z8632 Personal history of gestational diabetes: Secondary | ICD-10-CM

## 2019-04-05 MED ORDER — BLOOD GLUCOSE MONITOR KIT: PACK | 0 refills | Status: DC

## 2019-04-10 ENCOUNTER — Encounter: Payer: Self-pay | Admitting: Obstetrics and Gynecology

## 2019-04-10 ENCOUNTER — Other Ambulatory Visit: Payer: Medicaid Other

## 2019-04-10 ENCOUNTER — Other Ambulatory Visit: Payer: Self-pay

## 2019-04-10 ENCOUNTER — Ambulatory Visit (INDEPENDENT_AMBULATORY_CARE_PROVIDER_SITE_OTHER): Payer: Medicaid Other | Admitting: Obstetrics and Gynecology

## 2019-04-10 VITALS — BP 114/72 | HR 78 | Wt 186.5 lb

## 2019-04-10 DIAGNOSIS — Z23 Encounter for immunization: Secondary | ICD-10-CM | POA: Diagnosis not present

## 2019-04-10 DIAGNOSIS — Z3483 Encounter for supervision of other normal pregnancy, third trimester: Secondary | ICD-10-CM

## 2019-04-10 DIAGNOSIS — Z3A28 28 weeks gestation of pregnancy: Secondary | ICD-10-CM

## 2019-04-10 DIAGNOSIS — Z3482 Encounter for supervision of other normal pregnancy, second trimester: Secondary | ICD-10-CM

## 2019-04-10 LAB — POCT URINALYSIS DIPSTICK OB
Bilirubin, UA: NEGATIVE
Blood, UA: NEGATIVE
Glucose, UA: NEGATIVE
Ketones, UA: NEGATIVE
Leukocytes, UA: NEGATIVE
Nitrite, UA: NEGATIVE
Spec Grav, UA: 1.01 (ref 1.010–1.025)
Urobilinogen, UA: 0.2 E.U./dL
pH, UA: 6.5 (ref 5.0–8.0)

## 2019-04-10 NOTE — Progress Notes (Signed)
Patient comes in today for ROB visit. She has no concerns today.  

## 2019-04-10 NOTE — Progress Notes (Signed)
ROB: No complaints.  Patient has a history of gestational diabetes and she has chosen to do the 1 hour GCT today.  If she fails she is aware that she must do the 3-hour GTT.

## 2019-04-11 LAB — CBC
Hematocrit: 32.2 % — ABNORMAL LOW (ref 34.0–46.6)
Hemoglobin: 10.6 g/dL — ABNORMAL LOW (ref 11.1–15.9)
MCH: 28.6 pg (ref 26.6–33.0)
MCHC: 32.9 g/dL (ref 31.5–35.7)
MCV: 87 fL (ref 79–97)
Platelets: 225 10*3/uL (ref 150–450)
RBC: 3.71 x10E6/uL — ABNORMAL LOW (ref 3.77–5.28)
RDW: 12.5 % (ref 11.7–15.4)
WBC: 7.4 10*3/uL (ref 3.4–10.8)

## 2019-04-11 LAB — GLUCOSE, 1 HOUR GESTATIONAL: Gestational Diabetes Screen: 154 mg/dL — ABNORMAL HIGH (ref 65–139)

## 2019-04-11 LAB — RPR: RPR Ser Ql: NONREACTIVE

## 2019-04-18 ENCOUNTER — Other Ambulatory Visit: Payer: Self-pay

## 2019-04-18 ENCOUNTER — Other Ambulatory Visit: Payer: Medicaid Other

## 2019-04-18 DIAGNOSIS — R7309 Other abnormal glucose: Secondary | ICD-10-CM

## 2019-04-18 DIAGNOSIS — Z8632 Personal history of gestational diabetes: Secondary | ICD-10-CM

## 2019-04-19 LAB — GESTATIONAL GLUCOSE TOLERANCE
Glucose, Fasting: 74 mg/dL (ref 65–94)
Glucose, GTT - 1 Hour: 197 mg/dL — ABNORMAL HIGH (ref 65–179)
Glucose, GTT - 2 Hour: 149 mg/dL (ref 65–154)
Glucose, GTT - 3 Hour: 63 mg/dL — ABNORMAL LOW (ref 65–139)

## 2019-05-02 ENCOUNTER — Other Ambulatory Visit: Payer: Self-pay

## 2019-05-02 ENCOUNTER — Ambulatory Visit (INDEPENDENT_AMBULATORY_CARE_PROVIDER_SITE_OTHER): Payer: Medicaid Other | Admitting: Obstetrics and Gynecology

## 2019-05-02 ENCOUNTER — Encounter: Payer: Self-pay | Admitting: Obstetrics and Gynecology

## 2019-05-02 VITALS — BP 100/64 | HR 94 | Wt 191.6 lb

## 2019-05-02 DIAGNOSIS — O09299 Supervision of pregnancy with other poor reproductive or obstetric history, unspecified trimester: Secondary | ICD-10-CM

## 2019-05-02 DIAGNOSIS — O09292 Supervision of pregnancy with other poor reproductive or obstetric history, second trimester: Secondary | ICD-10-CM

## 2019-05-02 DIAGNOSIS — O0993 Supervision of high risk pregnancy, unspecified, third trimester: Secondary | ICD-10-CM

## 2019-05-02 DIAGNOSIS — Z8632 Personal history of gestational diabetes: Secondary | ICD-10-CM

## 2019-05-02 DIAGNOSIS — Z659 Problem related to unspecified psychosocial circumstances: Secondary | ICD-10-CM

## 2019-05-02 LAB — POCT URINALYSIS DIPSTICK OB
Bilirubin, UA: NEGATIVE
Blood, UA: NEGATIVE
Glucose, UA: NEGATIVE
Ketones, UA: NEGATIVE
Leukocytes, UA: NEGATIVE
Nitrite, UA: NEGATIVE
Spec Grav, UA: 1.025 (ref 1.010–1.025)
Urobilinogen, UA: 0.2 E.U./dL
pH, UA: 6.5 (ref 5.0–8.0)

## 2019-05-02 NOTE — Patient Instructions (Addendum)
Third Trimester of Pregnancy The third trimester is from week 28 through week 40 (months 7 through 9). The third trimester is a time when the unborn baby (fetus) is growing rapidly. At the end of the ninth month, the fetus is about 20 inches in length and weighs 6-10 pounds. Body changes during your third trimester Your body will continue to go through many changes during pregnancy. The changes vary from woman to woman. During the third trimester:  Your weight will continue to increase. You can expect to gain 25-35 pounds (11-16 kg) by the end of the pregnancy.  You may begin to get stretch marks on your hips, abdomen, and breasts.  You may urinate more often because the fetus is moving lower into your pelvis and pressing on your bladder.  You may develop or continue to have heartburn. This is caused by increased hormones that slow down muscles in the digestive tract.  You may develop or continue to have constipation because increased hormones slow digestion and cause the muscles that push waste through your intestines to relax.  You may develop hemorrhoids. These are swollen veins (varicose veins) in the rectum that can itch or be painful.  You may develop swollen, bulging veins (varicose veins) in your legs.  You may have increased body aches in the pelvis, back, or thighs. This is due to weight gain and increased hormones that are relaxing your joints.  You may have changes in your hair. These can include thickening of your hair, rapid growth, and changes in texture. Some women also have hair loss during or after pregnancy, or hair that feels dry or thin. Your hair will most likely return to normal after your baby is born.  Your breasts will continue to grow and they will continue to become tender. A yellow fluid (colostrum) may leak from your breasts. This is the first milk you are producing for your baby.  Your belly button may stick out.  You may notice more swelling in your hands,  face, or ankles.  You may have increased tingling or numbness in your hands, arms, and legs. The skin on your belly may also feel numb.  You may feel short of breath because of your expanding uterus.  You may have more problems sleeping. This can be caused by the size of your belly, increased need to urinate, and an increase in your body's metabolism.  You may notice the fetus "dropping," or moving lower in your abdomen (lightening).  You may have increased vaginal discharge.  You may notice your joints feel loose and you may have pain around your pelvic bone. What to expect at prenatal visits You will have prenatal exams every 2 weeks until week 36. Then you will have weekly prenatal exams. During a routine prenatal visit:  You will be weighed to make sure you and the baby are growing normally.  Your blood pressure will be taken.  Your abdomen will be measured to track your baby's growth.  The fetal heartbeat will be listened to.  Any test results from the previous visit will be discussed.  You may have a cervical check near your due date to see if your cervix has softened or thinned (effaced).  You will be tested for Group B streptococcus. This happens between 35 and 37 weeks. Your health care provider may ask you:  What your birth plan is.  How you are feeling.  If you are feeling the baby move.  If you have had any abnormal   symptoms, such as leaking fluid, bleeding, severe headaches, or abdominal cramping.  If you are using any tobacco products, including cigarettes, chewing tobacco, and electronic cigarettes.  If you have any questions. Other tests or screenings that may be performed during your third trimester include:  Blood tests that check for low iron levels (anemia).  Fetal testing to check the health, activity level, and growth of the fetus. Testing is done if you have certain medical conditions or if there are problems during the pregnancy.  Nonstress test  (NST). This test checks the health of your baby to make sure there are no signs of problems, such as the baby not getting enough oxygen. During this test, a belt is placed around your belly. The baby is made to move, and its heart rate is monitored during movement. What is false labor? False labor is a condition in which you feel small, irregular tightenings of the muscles in the womb (contractions) that usually go away with rest, changing position, or drinking water. These are called Braxton Hicks contractions. Contractions may last for hours, days, or even weeks before true labor sets in. If contractions come at regular intervals, become more frequent, increase in intensity, or become painful, you should see your health care provider. What are the signs of labor?  Abdominal cramps.  Regular contractions that start at 10 minutes apart and become stronger and more frequent with time.  Contractions that start on the top of the uterus and spread down to the lower abdomen and back.  Increased pelvic pressure and dull back pain.  A watery or bloody mucus discharge that comes from the vagina.  Leaking of amniotic fluid. This is also known as your "water breaking." It could be a slow trickle or a gush. Let your health care provider know if it has a color or strange odor. If you have any of these signs, call your health care provider right away, even if it is before your due date. Follow these instructions at home: Medicines  Follow your health care provider's instructions regarding medicine use. Specific medicines may be either safe or unsafe to take during pregnancy.  Take a prenatal vitamin that contains at least 600 micrograms (mcg) of folic acid.  If you develop constipation, try taking a stool softener if your health care provider approves. Eating and drinking   Eat a balanced diet that includes fresh fruits and vegetables, whole grains, good sources of protein such as meat, eggs, or tofu,  and low-fat dairy. Your health care provider will help you determine the amount of weight gain that is right for you.  Avoid raw meat and uncooked cheese. These carry germs that can cause birth defects in the baby.  If you have low calcium intake from food, talk to your health care provider about whether you should take a daily calcium supplement.  Eat four or five small meals rather than three large meals a day.  Limit foods that are high in fat and processed sugars, such as fried and sweet foods.  To prevent constipation: ? Drink enough fluid to keep your urine clear or pale yellow. ? Eat foods that are high in fiber, such as fresh fruits and vegetables, whole grains, and beans. Activity  Exercise only as directed by your health care provider. Most women can continue their usual exercise routine during pregnancy. Try to exercise for 30 minutes at least 5 days a week. Stop exercising if you experience uterine contractions.  Avoid heavy lifting.  Do   not exercise in extreme heat or humidity, or at high altitudes.  Wear low-heel, comfortable shoes.  Practice good posture.  You may continue to have sex unless your health care provider tells you otherwise. Relieving pain and discomfort  Take frequent breaks and rest with your legs elevated if you have leg cramps or low back pain.  Take warm sitz baths to soothe any pain or discomfort caused by hemorrhoids. Use hemorrhoid cream if your health care provider approves.  Wear a good support bra to prevent discomfort from breast tenderness.  If you develop varicose veins: ? Wear support pantyhose or compression stockings as told by your healthcare provider. ? Elevate your feet for 15 minutes, 3-4 times a day. Prenatal care  Write down your questions. Take them to your prenatal visits.  Keep all your prenatal visits as told by your health care provider. This is important. Safety  Wear your seat belt at all times when driving.  Make  a list of emergency phone numbers, including numbers for family, friends, the hospital, and police and fire departments. General instructions  Avoid cat litter boxes and soil used by cats. These carry germs that can cause birth defects in the baby. If you have a cat, ask someone to clean the litter box for you.  Do not travel far distances unless it is absolutely necessary and only with the approval of your health care provider.  Do not use hot tubs, steam rooms, or saunas.  Do not drink alcohol.  Do not use any products that contain nicotine or tobacco, such as cigarettes and e-cigarettes. If you need help quitting, ask your health care provider.  Do not use any medicinal herbs or unprescribed drugs. These chemicals affect the formation and growth of the baby.  Do not douche or use tampons or scented sanitary pads.  Do not cross your legs for long periods of time.  To prepare for the arrival of your baby: ? Take prenatal classes to understand, practice, and ask questions about labor and delivery. ? Make a trial run to the hospital. ? Visit the hospital and tour the maternity area. ? Arrange for maternity or paternity leave through employers. ? Arrange for family and friends to take care of pets while you are in the hospital. ? Purchase a rear-facing car seat and make sure you know how to install it in your car. ? Pack your hospital bag. ? Prepare the baby's nursery. Make sure to remove all pillows and stuffed animals from the baby's crib to prevent suffocation.  Visit your dentist if you have not gone during your pregnancy. Use a soft toothbrush to brush your teeth and be gentle when you floss. Contact a health care provider if:  You are unsure if you are in labor or if your water has broken.  You become dizzy.  You have mild pelvic cramps, pelvic pressure, or nagging pain in your abdominal area.  You have lower back pain.  You have persistent nausea, vomiting, or  diarrhea.  You have an unusual or bad smelling vaginal discharge.  You have pain when you urinate. Get help right away if:  Your water breaks before 37 weeks.  You have regular contractions less than 5 minutes apart before 37 weeks.  You have a fever.  You are leaking fluid from your vagina.  You have spotting or bleeding from your vagina.  You have severe abdominal pain or cramping.  You have rapid weight loss or weight gain.  You have   shortness of breath with chest pain.  You notice sudden or extreme swelling of your face, hands, ankles, feet, or legs.  Your baby makes fewer than 10 movements in 2 hours.  You have severe headaches that do not go away when you take medicine.  You have vision changes. Summary  The third trimester is from week 28 through week 40, months 7 through 9. The third trimester is a time when the unborn baby (fetus) is growing rapidly.  During the third trimester, your discomfort may increase as you and your baby continue to gain weight. You may have abdominal, leg, and back pain, sleeping problems, and an increased need to urinate.  During the third trimester your breasts will keep growing and they will continue to become tender. A yellow fluid (colostrum) may leak from your breasts. This is the first milk you are producing for your baby.  False labor is a condition in which you feel small, irregular tightenings of the muscles in the womb (contractions) that eventually go away. These are called Braxton Hicks contractions. Contractions may last for hours, days, or even weeks before true labor sets in.  Signs of labor can include: abdominal cramps; regular contractions that start at 10 minutes apart and become stronger and more frequent with time; watery or bloody mucus discharge that comes from the vagina; increased pelvic pressure and dull back pain; and leaking of amniotic fluid. This information is not intended to replace advice given to you by your  health care provider. Make sure you discuss any questions you have with your health care provider. Document Released: 04/26/2001 Document Revised: 08/23/2018 Document Reviewed: 06/07/2016 Elsevier Patient Education  2020 [employer]    Breastfeeding  Choosing to breastfeed is one of the best decisions you can make for yourself and your baby. A change in hormones during pregnancy causes your breasts to make breast milk in your milk-producing glands. Hormones prevent breast milk from being released before your baby is born. They also prompt milk flow after birth. Once breastfeeding has begun, thoughts of your baby, as well as his or her sucking or crying, can stimulate the release of milk from your milk-producing glands. Benefits of breastfeeding Research shows that breastfeeding offers many health benefits for infants and mothers. It also offers a cost-free and convenient way to feed your baby. For your baby  Your first milk (colostrum) helps your baby's digestive system to function better.  Special cells in your milk (antibodies) help your baby to fight off infections.  Breastfed babies are less likely to develop asthma, allergies, obesity, or type 2 diabetes. They are also at lower risk for sudden infant death syndrome (SIDS).  Nutrients in breast milk are better able to meet your baby's needs compared to infant formula.  Breast milk improves your baby's brain development. For you  Breastfeeding helps to create a very special bond between you and your baby.  Breastfeeding is convenient. Breast milk costs nothing and is always available at the correct temperature.  Breastfeeding helps to burn calories. It helps you to lose the weight that you gained during pregnancy.  Breastfeeding makes your uterus return faster to its size before pregnancy. It also slows bleeding (lochia) after you give birth.  Breastfeeding helps to lower your risk of developing type 2 diabetes, osteoporosis,  rheumatoid arthritis, cardiovascular disease, and breast, ovarian, uterine, and endometrial cancer later in life. Breastfeeding basics Starting breastfeeding  Find a comfortable place to sit or lie down, with your neck and back  well-supported.  Place a pillow or a rolled-up blanket under your baby to bring him or her to the level of your breast (if you are seated). Nursing pillows are specially designed to help support your arms and your baby while you breastfeed.  Make sure that your baby's tummy (abdomen) is facing your abdomen.  Gently massage your breast. With your fingertips, massage from the outer edges of your breast inward toward the nipple. This encourages milk flow. If your milk flows slowly, you may need to continue this action during the feeding.  Support your breast with 4 fingers underneath and your thumb above your nipple (make the letter "C" with your hand). Make sure your fingers are well away from your nipple and your baby's mouth.  Stroke your baby's lips gently with your finger or nipple.  When your baby's mouth is open wide enough, quickly bring your baby to your breast, placing your entire nipple and as much of the areola as possible into your baby's mouth. The areola is the colored area around your nipple. ? More areola should be visible above your baby's upper lip than below the lower lip. ? Your baby's lips should be opened and extended outward (flanged) to ensure an adequate, comfortable latch. ? Your baby's tongue should be between his or her lower gum and your breast.  Make sure that your baby's mouth is correctly positioned around your nipple (latched). Your baby's lips should create a seal on your breast and be turned out (everted).  It is common for your baby to suck about 2-3 minutes in order to start the flow of breast milk. Latching Teaching your baby how to latch onto your breast properly is very important. An improper latch can cause nipple pain, decreased  milk supply, and poor weight gain in your baby. Also, if your baby is not latched onto your nipple properly, he or she may swallow some air during feeding. This can make your baby fussy. Burping your baby when you switch breasts during the feeding can help to get rid of the air. However, teaching your baby to latch on properly is still the best way to prevent fussiness from swallowing air while breastfeeding. Signs that your baby has successfully latched onto your nipple  Silent tugging or silent sucking, without causing you pain. Infant's lips should be extended outward (flanged).  Swallowing heard between every 3-4 sucks once your milk has started to flow (after your let-down milk reflex occurs).  Muscle movement above and in front of his or her ears while sucking. Signs that your baby has not successfully latched onto your nipple  Sucking sounds or smacking sounds from your baby while breastfeeding.  Nipple pain. If you think your baby has not latched on correctly, slip your finger into the corner of your baby's mouth to break the suction and place it between your baby's gums. Attempt to start breastfeeding again. Signs of successful breastfeeding Signs from your baby  Your baby will gradually decrease the number of sucks or will completely stop sucking.  Your baby will fall asleep.  Your baby's body will relax.  Your baby will retain a small amount of milk in his or her mouth.  Your baby will let go of your breast by himself or herself. Signs from you  Breasts that have increased in firmness, weight, and size 1-3 hours after feeding.  Breasts that are softer immediately after breastfeeding.  Increased milk volume, as well as a change in milk consistency and color  by the fifth day of breastfeeding.  Nipples that are not sore, cracked, or bleeding. Signs that your baby is getting enough milk  Wetting at least 1-2 diapers during the first 24 hours after birth.  Wetting at  least 5-6 diapers every 24 hours for the first week after birth. The urine should be clear or pale yellow by the age of 5 days.  Wetting 6-8 diapers every 24 hours as your baby continues to grow and develop.  At least 3 stools in a 24-hour period by the age of 5 days. The stool should be soft and yellow.  At least 3 stools in a 24-hour period by the age of 7 days. The stool should be seedy and yellow.  No loss of weight greater than 10% of birth weight during the first 3 days of life.  Average weight gain of 4-7 oz (113-198 g) per week after the age of 4 days.  Consistent daily weight gain by the age of 5 days, without weight loss after the age of 2 weeks. After a feeding, your baby may spit up a small amount of milk. This is normal. Breastfeeding frequency and duration Frequent feeding will help you make more milk and can prevent sore nipples and extremely full breasts (breast engorgement). Breastfeed when you feel the need to reduce the fullness of your breasts or when your baby shows signs of hunger. This is called "breastfeeding on demand." Signs that your baby is hungry include:  Increased alertness, activity, or restlessness.  Movement of the head from side to side.  Opening of the mouth when the corner of the mouth or cheek is stroked (rooting).  Increased sucking sounds, smacking lips, cooing, sighing, or squeaking.  Hand-to-mouth movements and sucking on fingers or hands.  Fussing or crying. Avoid introducing a pacifier to your baby in the first 4-6 weeks after your baby is born. After this time, you may choose to use a pacifier. Research has shown that pacifier use during the first year of a baby's life decreases the risk of sudden infant death syndrome (SIDS). Allow your baby to feed on each breast as long as he or she wants. When your baby unlatches or falls asleep while feeding from the first breast, offer the second breast. Because newborns are often sleepy in the first few  weeks of life, you may need to awaken your baby to get him or her to feed. Breastfeeding times will vary from baby to baby. However, the following rules can serve as a guide to help you make sure that your baby is properly fed:  Newborns (babies 84 weeks of age or younger) may breastfeed every 1-3 hours.  Newborns should not go without breastfeeding for longer than 3 hours during the day or 5 hours during the night.  You should breastfeed your baby a minimum of 8 times in a 24-hour period. Breast milk pumping     Pumping and storing breast milk allows you to make sure that your baby is exclusively fed your breast milk, even at times when you are unable to breastfeed. This is especially important if you go back to work while you are still breastfeeding, or if you are not able to be present during feedings. Your lactation consultant can help you find a method of pumping that works best for you and give you guidelines about how long it is safe to store breast milk. Caring for your breasts while you breastfeed Nipples can become dry, cracked, and sore while  breastfeeding. The following recommendations can help keep your breasts moisturized and healthy:  Avoid using soap on your nipples.  Wear a supportive bra designed especially for nursing. Avoid wearing underwire-style bras or extremely tight bras (sports bras).  Air-dry your nipples for 3-4 minutes after each feeding.  Use only cotton bra pads to absorb leaked breast milk. Leaking of breast milk between feedings is normal.  Use lanolin on your nipples after breastfeeding. Lanolin helps to maintain your skin's normal moisture barrier. Pure lanolin is not harmful (not toxic) to your baby. You may also hand express a few drops of breast milk and gently massage that milk into your nipples and allow the milk to air-dry. In the first few weeks after giving birth, some women experience breast engorgement. Engorgement can make your breasts feel heavy,  warm, and tender to the touch. Engorgement peaks within 3-5 days after you give birth. The following recommendations can help to ease engorgement:  Completely empty your breasts while breastfeeding or pumping. You may want to start by applying warm, moist heat (in the shower or with warm, water-soaked hand towels) just before feeding or pumping. This increases circulation and helps the milk flow. If your baby does not completely empty your breasts while breastfeeding, pump any extra milk after he or she is finished.  Apply ice packs to your breasts immediately after breastfeeding or pumping, unless this is too uncomfortable for you. To do this: ? Put ice in a plastic bag. ? Place a towel between your skin and the bag. ? Leave the ice on for 20 minutes, 2-3 times a day.  Make sure that your baby is latched on and positioned properly while breastfeeding. If engorgement persists after 48 hours of following these recommendations, contact your health care provider or a Advertising copywriterlactation consultant. Overall health care recommendations while breastfeeding  Eat 3 healthy meals and 3 snacks every day. Well-nourished mothers who are breastfeeding need an additional 450-500 calories a day. You can meet this requirement by increasing the amount of a balanced diet that you eat.  Drink enough water to keep your urine pale yellow or clear.  Rest often, relax, and continue to take your prenatal vitamins to prevent fatigue, stress, and low vitamin and mineral levels in your body (nutrient deficiencies).  Do not use any products that contain nicotine or tobacco, such as cigarettes and e-cigarettes. Your baby may be harmed by chemicals from cigarettes that pass into breast milk and exposure to secondhand smoke. If you need help quitting, ask your health care provider.  Avoid alcohol.  Do not use illegal drugs or marijuana.  Talk with your health care provider before taking any medicines. These include over-the-counter  and prescription medicines as well as vitamins and herbal supplements. Some medicines that may be harmful to your baby can pass through breast milk.  It is possible to become pregnant while breastfeeding. If birth control is desired, ask your health care provider about options that will be safe while breastfeeding your baby. Where to find more information: [employer]: www.llli.org Contact a health care provider if:  You feel like you want to stop breastfeeding or have become frustrated with breastfeeding.  Your nipples are cracked or bleeding.  Your breasts are red, tender, or warm.  You have: ? Painful breasts or nipples. ? A swollen area on either breast. ? A fever or chills. ? Nausea or vomiting. ? Drainage other than breast milk from your nipples.  Your breasts do not become  full before feedings by the fifth day after you give birth.  You feel sad and depressed.  Your baby is: ? Too sleepy to eat well. ? Having trouble sleeping. ? More than 28 week old and wetting fewer than 6 diapers in a 24-hour period. ? Not gaining weight by 54 days of age.  Your baby has fewer than 3 stools in a 24-hour period.  Your baby's skin or the white parts of his or her eyes become yellow. Get help right away if:  Your baby is overly tired (lethargic) and does not want to wake up and feed.  Your baby develops an unexplained fever. Summary  Breastfeeding offers many health benefits for infant and mothers.  Try to breastfeed your infant when he or she shows early signs of hunger.  Gently tickle or stroke your baby's lips with your finger or nipple to allow the baby to open his or her mouth. Bring the baby to your breast. Make sure that much of the areola is in your baby's mouth. Offer one side and burp the baby before you offer the other side.  Talk with your health care provider or lactation consultant if you have questions or you face problems as you breastfeed. This  information is not intended to replace advice given to you by your health care provider. Make sure you discuss any questions you have with your health care provider. Document Released: 05/02/2005 Document Revised: 07/27/2017 Document Reviewed: 06/03/2016 Elsevier Patient Education  2020 [employer].

## 2019-05-02 NOTE — Progress Notes (Signed)
ROB-Pt present for routine prenatal care. Pt stated that she was doing well no problems.  
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
## 2019-05-16 ENCOUNTER — Ambulatory Visit (INDEPENDENT_AMBULATORY_CARE_PROVIDER_SITE_OTHER): Payer: Medicaid Other | Admitting: Obstetrics and Gynecology

## 2019-05-16 ENCOUNTER — Encounter: Payer: Self-pay | Admitting: Obstetrics and Gynecology

## 2019-05-16 ENCOUNTER — Other Ambulatory Visit: Payer: Self-pay

## 2019-05-16 VITALS — BP 113/78 | HR 93 | Wt 194.8 lb

## 2019-05-16 DIAGNOSIS — O0993 Supervision of high risk pregnancy, unspecified, third trimester: Secondary | ICD-10-CM | POA: Diagnosis not present

## 2019-05-16 LAB — POCT URINALYSIS DIPSTICK OB
Bilirubin, UA: NEGATIVE
Blood, UA: NEGATIVE
Glucose, UA: NEGATIVE
Ketones, UA: NEGATIVE
Leukocytes, UA: NEGATIVE
Nitrite, UA: NEGATIVE
POC,PROTEIN,UA: NEGATIVE
Spec Grav, UA: 1.025 (ref 1.010–1.025)
Urobilinogen, UA: 0.2 E.U./dL
pH, UA: 6.5 (ref 5.0–8.0)

## 2019-05-16 NOTE — Progress Notes (Signed)
ROB-Pt present for routine prenatal care. Pt stated having leg pains and cramping. Pt stated also having lower back pain.

## 2019-05-16 NOTE — Progress Notes (Signed)
ROB: Occasional leg cramps and back pain but otherwise well.  Still living with her mother.  Daily fetal movement.  Denies contractions.

## 2019-05-17 NOTE — L&D Delivery Note (Signed)
Delivery Summary for Marie Green  Labor Events:   Preterm labor: No data found  Rupture date: 06/30/2019  Rupture time: 5:09 AM  Rupture type: Spontaneous Intact Possible ROM - for evaluation  Fluid Color: Clear Light Meconium  Induction: No data found  Augmentation: No data found  Complications: No data found  Cervical ripening: No data found No data found   No data found     Delivery:   Episiotomy: No data found  Lacerations: No data found  Repair suture: No data found  Repair # of packets: No data found  Blood loss (ml): 500   Information for the patient's newborn:  Amilliana, Hayworth Girl Ardell [[id number]]    Delivery 06/30/2019 5:10 AM by  Vaginal, Spontaneous Sex:  female Gestational Age: 103w3d Delivery Clinician:   Living?:         APGARS  One minute Five minutes Ten minutes  Skin color:        Heart rate:        Grimace:        Muscle tone:        Breathing:        Totals: 7  8      Presentation/position:      Resuscitation:   Cord information:    Disposition of cord blood:     Blood gases sent?  Complications:   Placenta: Delivered:       appearance Newborn Measurements: Weight: 7 lb 4.1 oz (3290 g)  Height: 19.49"  Head circumference:    Chest circumference:    Other providers:    Additional  information: Forceps:   Vacuum:   Breech:   Observed anomalies       Delivery Note At 5:10 AM a viable and healthy female was delivered precipitously via Vaginal, Spontaneous (Presentation: Vertex Left Occiput Anterior) by the nurse Raelyn Number, RN).  APGAR: 7, 8; weight 3290 grams.  MD arrived for delivery of the placenta. Placenta status: Spontaneous, Intact.  Cord: 3-vessel with the following complications: None.  Cord pH: not obtained.  Delayed cord clamping was observed.   Anesthesia: None Episiotomy: None Lacerations: None Suture Repair: None Est. Blood Loss (mL):  500  Mom to postpartum.  Baby to Nursery.  Hildred Laser, MD 06/30/2019, 6:23 AM

## 2019-05-31 ENCOUNTER — Encounter: Payer: Medicaid Other | Admitting: Obstetrics and Gynecology

## 2019-06-06 ENCOUNTER — Other Ambulatory Visit: Payer: Self-pay

## 2019-06-06 ENCOUNTER — Encounter: Payer: Medicaid Other | Admitting: Obstetrics and Gynecology

## 2019-06-06 ENCOUNTER — Encounter: Payer: Self-pay | Admitting: Obstetrics and Gynecology

## 2019-06-06 ENCOUNTER — Ambulatory Visit (INDEPENDENT_AMBULATORY_CARE_PROVIDER_SITE_OTHER): Payer: Medicaid Other | Admitting: Obstetrics and Gynecology

## 2019-06-06 VITALS — BP 129/80 | HR 90 | Wt 202.0 lb

## 2019-06-06 DIAGNOSIS — Z8632 Personal history of gestational diabetes: Secondary | ICD-10-CM

## 2019-06-06 DIAGNOSIS — O09292 Supervision of pregnancy with other poor reproductive or obstetric history, second trimester: Secondary | ICD-10-CM

## 2019-06-06 DIAGNOSIS — O0993 Supervision of high risk pregnancy, unspecified, third trimester: Secondary | ICD-10-CM

## 2019-06-06 DIAGNOSIS — O09299 Supervision of pregnancy with other poor reproductive or obstetric history, unspecified trimester: Secondary | ICD-10-CM

## 2019-06-06 LAB — POCT URINALYSIS DIPSTICK OB
Bilirubin, UA: NEGATIVE
Ketones, UA: NEGATIVE
Leukocytes, UA: NEGATIVE
Spec Grav, UA: >=1.03 — AB (ref 1.010–1.025)
Urobilinogen, UA: 0.2 E.U./dL
pH, UA: 6.5 (ref 5.0–8.0)

## 2019-06-06 LAB — OB RESULTS CONSOLE GBS: GBS: NEGATIVE

## 2019-06-06 NOTE — Progress Notes (Signed)
ROB-Pt present for routine prenatal care and 36 weeks labs. Pt stated have a lot of pain/pressure in the vaginal and lower back pain. Swollen hands and feet.

## 2019-06-06 NOTE — Progress Notes (Signed)
ROB: Patient complains of abdominal pain with fetal movement.  Otherwise no major issues. For NST today. 36 week labs done. RTC in 1 week.    NONSTRESS TEST INTERPRETATION  INDICATIONS: History of pregnancy loss in 2nd trimester  FHR baseline: 140 RESULTS:Reactive COMMENTS: No contractions. Mild uterine irritability   PLAN: 1. Continue fetal kick counts twice a day. 2. Continue antepartum testing as scheduled-weekly

## 2019-06-06 NOTE — Patient Instructions (Signed)
Nonstress Test A nonstress test is a procedure that is done during pregnancy in order to check the baby's heartbeat. The procedure can help show if the baby (fetus) is healthy. It is commonly done when:  The baby is past his or her due date.  The pregnancy is high risk.  The baby is moving less than normal.  The mother has lost a pregnancy in the past.  The health care provider suspects a problem with the baby's growth.  There is too much or too little amniotic fluid. The procedure is often done in the third trimester of pregnancy to find out if an early delivery is needed and whether such a delivery is safe. During a nonstress test, the baby's heartbeat is monitored when the baby is resting and when the baby is moving. If the baby is healthy, the heart rate will increase when he or she moves or kicks and will return to normal when he or she rests. Tell a health care provider about:  Any allergies you have.  Any medical conditions you have.  All medicines you are taking, including vitamins, herbs, eye drops, creams, and over-the-counter medicines. What are the risks? There are no risks to you or your baby from a nonstress test. This procedure should not be painful or uncomfortable. What happens before the procedure?  Eat a meal right before the test or as directed by your health care provider. Food may help encourage the baby to move.  Use the restroom right before the test. What happens during the procedure?  Two monitors will be placed on your abdomen. One will record the baby's heart rate and the other will record the contractions of your uterus.  You may be asked to lie down on your side or to sit upright.  You may be given a button to press when you feel your baby move.  Your health care provider will listen to your baby's heartbeat and recorded it. He or she may also watch your baby's heartbeat on a screen.  If the baby seems to be sleeping, you may be asked to drink  some juice or soda, eat a snack, or change positions. The procedure may vary among health care providers and hospitals. What happens after the procedure?  Your health care provider will discuss the test results with you and make recommendations for the future. Depending on the results, your health care provider may order additional tests or another course of action.  If your health care provider gave you any diet or activity instructions, make sure to follow them.  Keep all follow-up visits as told by your health care provider. This is important. Summary  A nonstress test is a procedure that is done during pregnancy in order to check the baby's heartbeat. The procedure can help show if the baby is healthy.  The procedure is often done in the third trimester of pregnancy to find out if an early delivery is needed and whether such a delivery is safe.  During a nonstress test, the baby's heartbeat is monitored when the baby is resting and when the baby is moving. If the baby is healthy, the heart rate will increase when he or she moves or kicks and will return to normal when he or she rests.  Your health care provider will discuss the test results with you and make recommendations for the future. This information is not intended to replace advice given to you by your health care provider. Make sure you discuss any   questions you have with your health care provider. Document Revised: 08/11/2016 Document Reviewed: 08/11/2016 Elsevier Patient Education  2020 Elsevier Inc.  

## 2019-06-07 ENCOUNTER — Telehealth: Payer: Self-pay | Admitting: Obstetrics and Gynecology

## 2019-06-07 NOTE — Telephone Encounter (Signed)
I sent her a my chart message.

## 2019-06-07 NOTE — Telephone Encounter (Signed)
I called the pt to sch. Her next appt. lmtrc & I sent pt a mychart message. The pt replied on mychart. Pts next appt  With evans for next week  She has a NST too where do you want me to put her ???

## 2019-06-07 NOTE — Telephone Encounter (Signed)
Called pt to schud next ob visit. lmtrc

## 2019-06-07 NOTE — Telephone Encounter (Signed)
Have her come in on Wednesday at 1:15 for NST and 2 for ROB. Thank you.

## 2019-06-08 LAB — GC/CHLAMYDIA PROBE AMP
Chlamydia trachomatis, NAA: NEGATIVE
Neisseria Gonorrhoeae by PCR: NEGATIVE

## 2019-06-08 LAB — STREP GP B NAA+RFLX: Strep Gp B NAA+Rflx: NEGATIVE

## 2019-06-12 ENCOUNTER — Encounter: Payer: Self-pay | Admitting: Obstetrics and Gynecology

## 2019-06-12 ENCOUNTER — Ambulatory Visit (INDEPENDENT_AMBULATORY_CARE_PROVIDER_SITE_OTHER): Payer: Medicaid Other | Admitting: Obstetrics and Gynecology

## 2019-06-12 ENCOUNTER — Other Ambulatory Visit: Payer: Medicaid Other

## 2019-06-12 ENCOUNTER — Other Ambulatory Visit: Payer: Self-pay

## 2019-06-12 VITALS — BP 117/83 | HR 76 | Wt 204.0 lb

## 2019-06-12 DIAGNOSIS — O0993 Supervision of high risk pregnancy, unspecified, third trimester: Secondary | ICD-10-CM

## 2019-06-12 LAB — POCT URINALYSIS DIPSTICK OB
Bilirubin, UA: NEGATIVE
Blood, UA: NEGATIVE
Glucose, UA: NEGATIVE
Ketones, UA: NEGATIVE
Leukocytes, UA: NEGATIVE
Nitrite, UA: NEGATIVE
Spec Grav, UA: 1.01 (ref 1.010–1.025)
Urobilinogen, UA: 0.2 E.U./dL
pH, UA: 6.5 (ref 5.0–8.0)

## 2019-06-12 NOTE — Progress Notes (Signed)
ROB: Patient complains of occasional contractions.  She did not bring her sugars today but reports fastings are usually below 100 and postprandials between 105 and 125.  NST reactive.

## 2019-06-18 ENCOUNTER — Other Ambulatory Visit: Payer: Medicaid Other

## 2019-06-18 ENCOUNTER — Encounter: Payer: Medicaid Other | Admitting: Obstetrics and Gynecology

## 2019-06-19 ENCOUNTER — Ambulatory Visit (INDEPENDENT_AMBULATORY_CARE_PROVIDER_SITE_OTHER): Payer: Medicaid Other | Admitting: Obstetrics and Gynecology

## 2019-06-19 ENCOUNTER — Other Ambulatory Visit: Payer: Medicaid Other

## 2019-06-19 ENCOUNTER — Other Ambulatory Visit: Payer: Self-pay

## 2019-06-19 DIAGNOSIS — O0993 Supervision of high risk pregnancy, unspecified, third trimester: Secondary | ICD-10-CM | POA: Diagnosis not present

## 2019-06-19 DIAGNOSIS — O09292 Supervision of pregnancy with other poor reproductive or obstetric history, second trimester: Secondary | ICD-10-CM

## 2019-06-19 DIAGNOSIS — O24410 Gestational diabetes mellitus in pregnancy, diet controlled: Secondary | ICD-10-CM

## 2019-06-19 DIAGNOSIS — O09299 Supervision of pregnancy with other poor reproductive or obstetric history, unspecified trimester: Secondary | ICD-10-CM

## 2019-06-19 DIAGNOSIS — K219 Gastro-esophageal reflux disease without esophagitis: Secondary | ICD-10-CM

## 2019-06-19 DIAGNOSIS — O99613 Diseases of the digestive system complicating pregnancy, third trimester: Secondary | ICD-10-CM

## 2019-06-19 LAB — POCT URINALYSIS DIPSTICK OB
Bilirubin, UA: NEGATIVE
Blood, UA: NEGATIVE
Glucose, UA: NEGATIVE
Ketones, UA: NEGATIVE
Leukocytes, UA: NEGATIVE
Nitrite, UA: NEGATIVE
Spec Grav, UA: <=1.005 — AB
Urobilinogen, UA: 0.2 U/dL
pH, UA: 6.5

## 2019-06-19 MED ORDER — PANTOPRAZOLE SODIUM 20 MG PO TBEC: 20.0000 mg | DELAYED_RELEASE_TABLET | Freq: Two times a day (BID) | ORAL | 0 refills | Status: DC

## 2019-06-19 NOTE — Progress Notes (Signed)
ROB: Noting more pain, pressure, and back pain.  Discussed labor precautions. NST performed today was reviewed and was found to be reactive.  Sugar log reviewed, last 2 fastings mildly elevated (110s), did not check sugars at all yesterday (notes she wasn't feeling well). Encouraged better compliance with checking blood sugars. NST done today. Continue recommended antenatal testing and prenatal care.  For growth scan next week with BPP.     NONSTRESS TEST INTERPRETATION  INDICATIONS: GDM (diet-controlled), history of second trimester loss  FHR baseline: 150 bpm RESULTS:Reactive COMMENTS: 1 variable deceleration from baseline to 120s with recovery.   PLAN: 1. Continue fetal kick counts twice a day. 2. Continue antepartum testing as scheduled-weekly

## 2019-06-19 NOTE — Progress Notes (Signed)
ROB-Pt present for routine prenatal care and NST. Pt stated that she was having vaginal pain and lower back pain.

## 2019-06-26 ENCOUNTER — Ambulatory Visit (INDEPENDENT_AMBULATORY_CARE_PROVIDER_SITE_OTHER): Payer: Medicaid Other | Admitting: Obstetrics and Gynecology

## 2019-06-26 ENCOUNTER — Ambulatory Visit (INDEPENDENT_AMBULATORY_CARE_PROVIDER_SITE_OTHER): Payer: Medicaid Other

## 2019-06-26 ENCOUNTER — Encounter: Payer: Self-pay | Admitting: Obstetrics and Gynecology

## 2019-06-26 ENCOUNTER — Other Ambulatory Visit: Payer: Self-pay

## 2019-06-26 VITALS — BP 129/76 | HR 76 | Wt 207.3 lb

## 2019-06-26 DIAGNOSIS — O0993 Supervision of high risk pregnancy, unspecified, third trimester: Secondary | ICD-10-CM

## 2019-06-26 DIAGNOSIS — O24410 Gestational diabetes mellitus in pregnancy, diet controlled: Secondary | ICD-10-CM | POA: Diagnosis not present

## 2019-06-26 LAB — POCT URINALYSIS DIPSTICK OB
Bilirubin, UA: NEGATIVE
Blood, UA: NEGATIVE
Glucose, UA: NEGATIVE
Ketones, UA: NEGATIVE
Leukocytes, UA: NEGATIVE
Nitrite, UA: NEGATIVE
Spec Grav, UA: 1.01 (ref 1.010–1.025)
Urobilinogen, UA: 0.2 E.U./dL
pH, UA: 6.5 (ref 5.0–8.0)

## 2019-06-26 NOTE — Progress Notes (Signed)
ROB: No complaints.  Irregular contractions.  BPP 8/8 today.  Fetal weight 7 pounds 10 ounces.  Sugar log reviewed-good.  Membrane sweeping at patient request.  Of significant note patient received approval and has a house!

## 2019-06-26 NOTE — Progress Notes (Signed)
Patient comes in today for ROB visit. She said that she is having diarrhea this morning.

## 2019-06-30 ENCOUNTER — Encounter: Payer: Self-pay | Admitting: Obstetrics and Gynecology

## 2019-06-30 ENCOUNTER — Inpatient Hospital Stay
Admission: EM | Admit: 2019-06-30 | Discharge: 2019-07-02 | DRG: 807 | Disposition: A | Discharge status: Home / Self Care | Payer: Medicaid Other | Attending: Obstetrics and Gynecology | Admitting: Obstetrics and Gynecology

## 2019-06-30 ENCOUNTER — Other Ambulatory Visit: Payer: Self-pay

## 2019-06-30 DIAGNOSIS — O09299 Supervision of pregnancy with other poor reproductive or obstetric history, unspecified trimester: Secondary | ICD-10-CM

## 2019-06-30 DIAGNOSIS — Z20822 Contact with and (suspected) exposure to covid-19: Secondary | ICD-10-CM | POA: Diagnosis present

## 2019-06-30 DIAGNOSIS — Z3A39 39 weeks gestation of pregnancy: Secondary | ICD-10-CM | POA: Diagnosis not present

## 2019-06-30 DIAGNOSIS — D649 Anemia, unspecified: Secondary | ICD-10-CM | POA: Diagnosis present

## 2019-06-30 DIAGNOSIS — Z87891 Personal history of nicotine dependence: Secondary | ICD-10-CM

## 2019-06-30 DIAGNOSIS — O26893 Other specified pregnancy related conditions, third trimester: Secondary | ICD-10-CM | POA: Diagnosis present

## 2019-06-30 DIAGNOSIS — O9902 Anemia complicating childbirth: Secondary | ICD-10-CM | POA: Diagnosis present

## 2019-06-30 LAB — TYPE AND SCREEN
ABO/RH(D): B POS
Antibody Screen: NEGATIVE

## 2019-06-30 LAB — RESPIRATORY PANEL BY RT PCR (FLU A&B, COVID)
Influenza A by PCR: NEGATIVE
Influenza B by PCR: NEGATIVE
SARS Coronavirus 2 by RT PCR: NEGATIVE

## 2019-06-30 LAB — CBC
HCT: 33 % — ABNORMAL LOW (ref 36.0–46.0)
Hemoglobin: 10.8 g/dL — ABNORMAL LOW (ref 12.0–15.0)
MCH: 28.4 pg (ref 26.0–34.0)
MCHC: 32.7 g/dL (ref 30.0–36.0)
MCV: 86.8 fL (ref 80.0–100.0)
Platelets: 224 10*3/uL (ref 150–400)
RBC: 3.8 MIL/uL — ABNORMAL LOW (ref 3.87–5.11)
RDW: 13.6 % (ref 11.5–15.5)
WBC: 9.3 10*3/uL (ref 4.0–10.5)
nRBC: 0 % (ref 0.0–0.2)

## 2019-06-30 LAB — ABO/RH: ABO/RH(D): B POS

## 2019-06-30 LAB — RPR: RPR Ser Ql: NONREACTIVE

## 2019-06-30 LAB — GLUCOSE, CAPILLARY: Glucose-Capillary: 100 mg/dL — ABNORMAL HIGH (ref 70–99)

## 2019-06-30 MED ORDER — LACTATED RINGERS IV SOLN: 500.0000 mL | INTRAVENOUS | Status: DC | PRN

## 2019-06-30 MED ORDER — ACETAMINOPHEN 325 MG PO TABS
650.0000 mg | ORAL_TABLET | ORAL | Status: DC | PRN
  Administered 2019-06-30: 650 mg via ORAL
  Filled 2019-06-30: qty 2

## 2019-06-30 MED ORDER — DIBUCAINE (PERIANAL) 1 % EX OINT: 1.0000 "application " | TOPICAL_OINTMENT | CUTANEOUS | Status: DC | PRN

## 2019-06-30 MED ORDER — OXYCODONE-ACETAMINOPHEN 5-325 MG PO TABS
2.0000 | ORAL_TABLET | Freq: Once | ORAL | Status: AC
  Administered 2019-06-30: 11:00:00 2 via ORAL
  Filled 2019-06-30: qty 2

## 2019-06-30 MED ORDER — LACTATED RINGERS IV SOLN: INTRAVENOUS | Status: DC

## 2019-06-30 MED ORDER — AMMONIA AROMATIC IN INHA
RESPIRATORY_TRACT | Status: AC
  Filled 2019-06-30: qty 10

## 2019-06-30 MED ORDER — SENNOSIDES-DOCUSATE SODIUM 8.6-50 MG PO TABS
2.0000 | ORAL_TABLET | ORAL | Status: DC
  Administered 2019-07-01 (×2): 2 via ORAL
  Filled 2019-06-30 (×3): qty 2

## 2019-06-30 MED ORDER — ONDANSETRON HCL 4 MG/2ML IJ SOLN: 4.0000 mg | Freq: Four times a day (QID) | INTRAMUSCULAR | Status: DC | PRN

## 2019-06-30 MED ORDER — SIMETHICONE 80 MG PO CHEW: 80.0000 mg | CHEWABLE_TABLET | ORAL | Status: DC | PRN

## 2019-06-30 MED ORDER — ONDANSETRON HCL 4 MG/2ML IJ SOLN: 4.0000 mg | INTRAMUSCULAR | Status: DC | PRN

## 2019-06-30 MED ORDER — ACETAMINOPHEN 325 MG PO TABS: 650.0000 mg | ORAL_TABLET | ORAL | Status: DC | PRN

## 2019-06-30 MED ORDER — WITCH HAZEL-GLYCERIN EX PADS: 1.0000 "application " | MEDICATED_PAD | CUTANEOUS | Status: DC | PRN

## 2019-06-30 MED ORDER — LIDOCAINE HCL (PF) 1 % IJ SOLN: 30.0000 mL | INTRAMUSCULAR | Status: DC | PRN

## 2019-06-30 MED ORDER — LIDOCAINE HCL (PF) 1 % IJ SOLN
INTRAMUSCULAR | Status: AC
  Filled 2019-06-30: qty 30

## 2019-06-30 MED ORDER — OXYTOCIN 10 UNIT/ML IJ SOLN
INTRAMUSCULAR | Status: AC
  Filled 2019-06-30: qty 2

## 2019-06-30 MED ORDER — DIPHENHYDRAMINE HCL 25 MG PO CAPS: 25.0000 mg | ORAL_CAPSULE | Freq: Four times a day (QID) | ORAL | Status: DC | PRN

## 2019-06-30 MED ORDER — BUTORPHANOL TARTRATE 1 MG/ML IJ SOLN: 1.0000 mg | INTRAMUSCULAR | Status: DC | PRN

## 2019-06-30 MED ORDER — BENZOCAINE-MENTHOL 20-0.5 % EX AERO: 1.0000 "application " | INHALATION_SPRAY | CUTANEOUS | Status: DC | PRN

## 2019-06-30 MED ORDER — ZOLPIDEM TARTRATE 5 MG PO TABS: 5.0000 mg | ORAL_TABLET | Freq: Every evening | ORAL | Status: DC | PRN

## 2019-06-30 MED ORDER — ONDANSETRON HCL 4 MG PO TABS: 4.0000 mg | ORAL_TABLET | ORAL | Status: DC | PRN

## 2019-06-30 MED ORDER — PRENATAL MULTIVITAMIN CH
1.0000 | ORAL_TABLET | Freq: Every day | ORAL | Status: DC
  Administered 2019-06-30 – 2019-07-01 (×2): 1 via ORAL
  Filled 2019-06-30 (×2): qty 1

## 2019-06-30 MED ORDER — IBUPROFEN 800 MG PO TABS
800.0000 mg | ORAL_TABLET | Freq: Four times a day (QID) | ORAL | Status: DC
  Administered 2019-06-30 – 2019-07-01 (×6): 800 mg via ORAL
  Filled 2019-06-30 (×6): qty 1

## 2019-06-30 MED ORDER — OXYTOCIN BOLUS FROM INFUSION
500.0000 mL | Freq: Once | INTRAVENOUS | Status: AC
  Administered 2019-06-30: 06:00:00 500 mL via INTRAVENOUS

## 2019-06-30 MED ORDER — MISOPROSTOL 200 MCG PO TABS
ORAL_TABLET | ORAL | Status: AC
  Filled 2019-06-30: qty 4

## 2019-06-30 MED ORDER — OXYCODONE-ACETAMINOPHEN 5-325 MG PO TABS: 1.0000 | ORAL_TABLET | ORAL | Status: DC | PRN

## 2019-06-30 MED ORDER — SOD CITRATE-CITRIC ACID 500-334 MG/5ML PO SOLN: 30.0000 mL | ORAL | Status: DC | PRN

## 2019-06-30 MED ORDER — OXYCODONE-ACETAMINOPHEN 5-325 MG PO TABS
2.0000 | ORAL_TABLET | ORAL | Status: DC | PRN
  Administered 2019-06-30: 06:00:00 2 via ORAL
  Filled 2019-06-30: qty 2

## 2019-06-30 MED ORDER — COCONUT OIL OIL
1.0000 "application " | TOPICAL_OIL | Status: DC | PRN
  Filled 2019-06-30: qty 120

## 2019-06-30 MED ORDER — OXYTOCIN 40 UNITS IN NORMAL SALINE INFUSION - SIMPLE MED
2.5000 [IU]/h | INTRAVENOUS | Status: DC
  Filled 2019-06-30: qty 1000

## 2019-06-30 NOTE — OB Triage Note (Addendum)
Patient came in for observation for labor evaluation. Patient reports regular uterine contractions every two to three minutes since 0230. Patient denies leaking of fluid and denies vaginal bleeding and spotting. Vital signs stable and patient afebrile. Patient reports + FM. FHR baseline 150 with moderate variability with accelerations 15 x 15 and no decelerations. Significant other at bedside. Will continue to monitor.
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
## 2019-06-30 NOTE — Lactation Note (Signed)
This note was copied from a baby's chart. Lactation Consultation Note  Patient Name: Marie Green [id number] Date: 06/30/2019   [name] was transferred to [facility] after delivery d/t respiratory issues.  Inititated pumping at 10:15 am today with Symphony pump.  Instructed in breast massage, hand expression, pumping, collection, storage, labeling, cleaning and handling of expressed colostrum or breast milk.  Mom expressed 6 ml with first pumping.  Encouraged mom to pump 8 or more times in 24 hours or around every 3 hours writing pumping times on white board for mom to check off.  Mom has pumped 4 times today taking any expressed milk to [facility]N when she visits.  Lactation name and number written on white board and encouraged to call with any questions, concerns or assistance.  Maternal Data    Feeding    LATCH Score                   Interventions    Lactation Tools Discussed/Used     Consult Status      [name] 06/30/2019, 10:01 PM

## 2019-07-01 LAB — CBC
HCT: 27.9 % — ABNORMAL LOW (ref 36.0–46.0)
Hemoglobin: 9 g/dL — ABNORMAL LOW (ref 12.0–15.0)
MCH: 28.4 pg (ref 26.0–34.0)
MCHC: 32.3 g/dL (ref 30.0–36.0)
MCV: 88 fL (ref 80.0–100.0)
Platelets: 192 10*3/uL (ref 150–400)
RBC: 3.17 MIL/uL — ABNORMAL LOW (ref 3.87–5.11)
RDW: 13.7 % (ref 11.5–15.5)
WBC: 11.4 10*3/uL — ABNORMAL HIGH (ref 4.0–10.5)
nRBC: 0 % (ref 0.0–0.2)

## 2019-07-01 LAB — GLUCOSE, CAPILLARY: Glucose-Capillary: 79 mg/dL (ref 70–99)

## 2019-07-01 MED ORDER — IBUPROFEN 800 MG PO TABS
800.0000 mg | ORAL_TABLET | Freq: Four times a day (QID) | ORAL | Status: DC
  Administered 2019-07-01: 800 mg via ORAL
  Filled 2019-07-01 (×2): qty 1

## 2019-07-01 MED ORDER — IBUPROFEN 800 MG PO TABS: 800.0000 mg | ORAL_TABLET | Freq: Three times a day (TID) | ORAL | 0 refills | Status: DC | PRN

## 2019-07-01 NOTE — Progress Notes (Signed)
Post Partum Day # 1, s/p SVD  Subjective: no complaints, up ad lib, voiding and tolerating PO  Objective: Temp:  [98 F (36.7 C)-98.8 F (37.1 C)] 98 F (36.7 C) (02/15 0803) Pulse Rate:  [66-83] 67 (02/15 0803) Resp:  [18-20] 20 (02/15 0803) BP: (105-130)/(70-94) 127/84 (02/15 0803) SpO2:  [96 %-100 %] 100 % (02/15 0803)  Physical Exam:  General: alert and no distress  Lungs: clear to auscultation bilaterally Breasts: normal appearance, no masses or tenderness Heart: regular rate and rhythm, S1, S2 normal, no murmur, click, rub or gallop Abdomen: soft, non-tender; bowel sounds normal; no masses,  no organomegaly Pelvis: Lochia: appropriate, Uterine Fundus: firm Extremities: DVT Evaluation: No evidence of DVT seen on physical exam. Negative Homan's sign. No cords or calf tenderness. No significant calf/ankle edema.  Recent Labs    06/30/19 0508 07/01/19 0529  HGB 10.8* 9.0*  HCT 33.0* 27.9*    Assessment/Plan: Doing well postpartum.  Breastfeeding, s/p Lactation consult  Contraception Mirena IUD Anemia of pregnancy, worsened with blood loss after delivery. Asymptomatic. Will treat with PO iron.   Plan for discharge tomorrow (infant recently roomed with mom, previously in special care nursery yesterday requiring supplemental oxygen).    LOS: 1 day   Hildred Laser, MD [facility] 07/01/2019 9:26 AM
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
## 2019-07-01 NOTE — Discharge Summary (Signed)
OB Discharge Summary     Patient Name: Marie Green DOB: Sep 20, 1985 MRN: [id number]  Date of admission: 06/30/2019 Delivering MD: Rubie Maid   Date of discharge: 07/02/2019  Admitting diagnosis: Labor and delivery indication for care or intervention [O75.9] Intrauterine pregnancy: [redacted]w[redacted]d    Secondary diagnosis:   Active Problems:   History of pre-eclampsia in prior pregnancy, currently pregnant   History of pregnancy loss in prior pregnancy, currently pregnant   History of gestational diabetes in prior pregnancy, currently pregnant  Additional problems: None     Discharge diagnosis: Term Pregnancy Delivered and Anemia                                                                                                Post partum procedures:None  Augmentation: None  Complications: None  Hospital course:  Onset of Labor With Vaginal Delivery     34y.o. yo [id number] admitted in Active Labor on 06/30/2019. Patient had an uncomplicated precipitous labor course as follows:  Membrane Rupture Time/Date: 5:09 AM ,06/30/2019   Intrapartum Procedures: Episiotomy: None [1]                                         Lacerations:  None [1]  Patient had a delivery of a Viable infant. 06/30/2019  Information for the patient's newborn:  Bruck, Girl [id number][[id number]]Delivery Method: Vaginal, Spontaneous(Filed from Delivery Summary)     Pateint had an uncomplicated postpartum course.  She is ambulating, tolerating a regular diet, passing flatus, and urinating well. Patient is discharged home in stable condition on 07/02/19.   Physical exam  Vitals:   07/01/19 0803 07/01/19 1559 07/01/19 2206 07/02/19 0755  BP: 127/84 126/85 133/87 117/67  Pulse: 67 71 77 78  Resp: '20 18 18 18  '$ Temp: 98 F (36.7 C) 98.4 F (36.9 C) 97.9 F (36.6 C) 97.7 F (36.5 C)  TempSrc: Oral Oral Oral Oral  SpO2: 100%  100% 100%  Weight:      Height:       General: alert and no distress Lochia:  appropriate Uterine Fundus: firm Incision: N/A DVT Evaluation: No evidence of DVT seen on physical exam. Negative Homan's sign. No cords or calf tenderness. No significant calf/ankle edema. Labs: Lab Results  Component Value Date   WBC 11.4 (H) 07/01/2019   HGB 9.0 (L) 07/01/2019   HCT 27.9 (L) 07/01/2019   MCV 88.0 07/01/2019   PLT 192 07/01/2019   CMP Latest Ref Rng & Units 10/25/2017  Glucose 65 - 99 mg/dL 93  BUN 6 - 20 mg/dL 9  Creatinine 0.44 - 1.00 mg/dL 0.74  Sodium 135 - 145 mmol/L 138  Potassium 3.5 - 5.1 mmol/L 3.6  Chloride 101 - 111 mmol/L 105  CO2 22 - 32 mmol/L 27  Calcium 8.9 - 10.3 mg/dL 9.6  Total Protein 6.5 - 8.1 g/dL 8.1  Total Bilirubin 0.3 - 1.2 mg/dL 1.5(H)  Alkaline Phos 38 - 126 U/L 72  AST 15 - 41 U/L 19  ALT 14 - 54 U/L 22    Discharge instruction: per After Visit Summary and "Baby and Me Booklet".  After visit meds:  Allergies as of 07/01/2019      Reactions   Mushroom Extract Complex Anaphylaxis   Amoxicillin    Flagyl [metronidazole] Rash      Medication List    STOP taking these medications   aspirin EC 81 MG tablet   blood glucose meter kit and supplies Kit   Doxylamine-Pyridoxine 10-10 MG Tbec Commonly known as: Diclegis   pantoprazole 20 MG tablet Commonly known as: Protonix     TAKE these medications   ibuprofen 800 MG tablet Commonly known as: ADVIL Take 1 tablet (800 mg total) by mouth every 8 (eight) hours as needed.   Prenate Pixie 10-0.6-0.4-200 MG Caps Take 1 capsule by mouth daily.       Diet: routine diet  Activity: Advance as tolerated. Pelvic rest for 6 weeks.   Outpatient follow up:6 weeks Follow up Appt: Future Appointments  Date Time Provider [place]  08/13/2019  9:45 AM Harlin Heys, MD [facility] None    Postpartum contraception: IUD Mirena   Postpartum depression screening: positive (EPDS = 11).  Declines therapy at this time. Circumstantial due to baby being in NICU, but is  now out.  Newborn Data: Live born female  Birth Weight: 7 lb 4.1 oz (3290 g) APGAR: 7, 8  Newborn Delivery   Birth date/time: 06/30/2019 05:10:00 Delivery type: Vaginal, Spontaneous      Baby Feeding: Bottle and Breast Disposition:home with mother   07/02/2019 Rubie Maid, MD

## 2019-07-02 ENCOUNTER — Other Ambulatory Visit: Payer: Medicaid Other

## 2019-07-02 ENCOUNTER — Encounter: Payer: Medicaid Other | Admitting: Obstetrics and Gynecology

## 2019-07-02 NOTE — Progress Notes (Signed)
Discharge order received from doctor. Reviewed discharge instructions and prescriptions with patient and answered all questions. Follow up appointment instructions given. Patient verbalized understanding. ID bands checked. Patient discharged home with infant via wheelchair by nursing/auxillary.    Cenia Zaragosa Garner, RN  

## 2019-07-02 NOTE — Progress Notes (Signed)
RNCM spoke with bedside nurse prior to assessment, no problems or concerns noted.  RNCM assessed mother at bedside, introduced self and role in facility. Mother was agreeable to answering some questions. Mother reports that this is her 4th child and she just moved into a new house. She reports feeling like she is ready to go home today and denies any other concerns. Mother reports no mental health history and does not take any medications. She is not employed but is able to drive herself to appointments. She gets assistance through [facility] and is waiting for nursing staff to set her up with pediatrician. Discussed both PPD and SIDS with mother and answered any questions as appropriate. Mother reports she has all necessary items for baby and is going to use a pack and play initially. Mother reports no further needs at this time and no other needs or concerns identified by this RNCM.

## 2019-07-02 NOTE — Lactation Note (Signed)
This note was copied from a baby's chart. Lactation Consultation Note  Patient Name: Marie Green [id number] Date: 07/02/2019 Reason for consult: Follow-up assessment  LC spoke briefly with parents before discharge this morning. Mom reports breastfeeding's to have continued to go well overnight, baby actively nursing with LC in the room this morning. Mom has no complaints, some mild discomfort that she is using coconut oil and expressed colostrum for. LC reviewed expectations with breastfeeding over the days/weeks to come, signs of adequate milk transfer, and breast care with anticipation of high milk volume with this being mom's 5th baby, and when to seek help from [facility] or MD for breast discomfort. Information given for outpatient lactation support, and community breastfeeding resources.  Maternal Data Formula Feeding for Exclusion: No Has patient been taught Hand Expression?: Yes Does the patient have breastfeeding experience prior to this delivery?: Yes  Feeding Feeding Type: Breast Fed  LATCH Score Latch: Grasps breast easily, tongue down, lips flanged, rhythmical sucking.  Audible Swallowing: Spontaneous and intermittent  Type of Nipple: Everted at rest and after stimulation  Comfort (Breast/Nipple): Filling, red/small blisters or bruises, mild/mod discomfort  Hold (Positioning): No assistance needed to correctly position infant at breast.  LATCH Score: 9  Interventions Interventions: Breast feeding basics reviewed  Lactation Tools Discussed/Used     Consult Status Consult Status: Complete Date: 07/02/19 Follow-up type: Call as needed    Danford Bad 07/02/2019, 9:10 AM

## 2019-07-02 NOTE — Discharge Instructions (Signed)

## 2019-07-04 ENCOUNTER — Inpatient Hospital Stay: Admit: 2019-07-04 | Payer: Self-pay

## 2019-08-12 NOTE — Progress Notes (Signed)
Pt present for 6 week postpartum care. Pt stated that she is breastfeeding the baby. Pt had sexually intercourse since the birth of the baby. Pt would like to have an IUD for birth control. UPt NEG. . Pt stated still having back pain off and on.

## 2019-08-13 ENCOUNTER — Encounter: Payer: Self-pay | Admitting: Obstetrics and Gynecology

## 2019-08-13 ENCOUNTER — Other Ambulatory Visit: Payer: Self-pay

## 2019-08-13 ENCOUNTER — Ambulatory Visit (INDEPENDENT_AMBULATORY_CARE_PROVIDER_SITE_OTHER): Payer: Medicaid Other | Admitting: Obstetrics and Gynecology

## 2019-08-13 ENCOUNTER — Encounter: Payer: Medicaid Other | Admitting: Obstetrics and Gynecology

## 2019-08-13 DIAGNOSIS — Z30430 Encounter for insertion of intrauterine contraceptive device: Secondary | ICD-10-CM | POA: Diagnosis not present

## 2019-08-13 DIAGNOSIS — Z8759 Personal history of other complications of pregnancy, childbirth and the puerperium: Secondary | ICD-10-CM

## 2019-08-13 DIAGNOSIS — R030 Elevated blood-pressure reading, without diagnosis of hypertension: Secondary | ICD-10-CM

## 2019-08-13 DIAGNOSIS — O9081 Anemia of the puerperium: Secondary | ICD-10-CM

## 2019-08-13 LAB — POCT URINE PREGNANCY: Preg Test, Ur: NEGATIVE

## 2019-08-13 NOTE — Patient Instructions (Addendum)
Intrauterine Device Insertion, Care After  This sheet gives you information about how to care for yourself after your procedure. Your health care provider may also give you more specific instructions. If you have problems or questions, contact your health care provider. What can I expect after the procedure? After the procedure, it is common to have:  Cramps and pain in the abdomen.  Light bleeding (spotting) or heavier bleeding that is like your menstrual period. This may last for up to a few days.  Lower back pain.  Dizziness.  Headaches.  Nausea. Follow these instructions at home:  Before resuming sexual activity, check to make sure that you can feel the IUD string(s). You should be able to feel the end of the string(s) below the opening of your cervix. If your IUD string is in place, you may resume sexual activity. ? If you had a hormonal IUD inserted more than 7 days after your most recent period started, you will need to use a backup method of birth control for 7 days after IUD insertion. Ask your health care provider whether this applies to you.  Continue to check that the IUD is still in place by feeling for the string(s) after every menstrual period, or once a month.  Take over-the-counter and prescription medicines only as told by your health care provider.  Do not drive or use heavy machinery while taking prescription pain medicine.  Keep all follow-up visits as told by your health care provider. This is important. Contact a health care provider if:  You have bleeding that is heavier or lasts longer than a normal menstrual cycle.  You have a fever.  You have cramps or abdominal pain that get worse or do not get better with medicine.  You develop abdominal pain that is new or is not in the same area of earlier cramping and pain.  You feel lightheaded or weak.  You have abnormal or bad-smelling discharge from your vagina.  You have pain during sexual  activity.  You have any of the following problems with your IUD string(s): ? The string bothers or hurts you or your sexual partner. ? You cannot feel the string. ? The string has gotten longer.  You can feel the IUD in your vagina.  You think you may be pregnant, or you miss your menstrual period.  You think you may have an STI (sexually transmitted infection). Get help right away if:  You have flu-like symptoms.  You have a fever and chills.  You can feel that your IUD has slipped out of place. Summary  After the procedure, it is common to have cramps and pain in the abdomen. It is also common to have light bleeding (spotting) or heavier bleeding that is like your menstrual period.  Continue to check that the IUD is still in place by feeling for the string(s) after every menstrual period, or once a month.  Keep all follow-up visits as told by your health care provider. This is important.  Contact your health care provider if you have problems with your IUD string(s), such as the string getting longer or bothering you or your sexual partner. This information is not intended to replace advice given to you by your health care provider. Make sure you discuss any questions you have with your health care provider. Document Revised: 04/14/2017 Document Reviewed: 03/23/2016 Elsevier Patient Education  2020 [employer]  

## 2019-08-13 NOTE — Progress Notes (Signed)
OBSTETRICS POSTPARTUM CLINIC PROGRESS NOTE  Subjective:     Marie Green is a 34 y.o. [phone] female who presents for a postpartum visit. She is 6 weeks postpartum following a spontaneous vaginal delivery. I have fully reviewed the prenatal and intrapartum course. The delivery was at 51 gestational weeks, precipitous labor.  Anesthesia: none. Postpartum course has been well. Baby's course has been well. Baby is feeding by both breast and bottle (notes Pediatrician recently recommended supplementation due to neonatal weight loss). Bleeding: patient has not resumed menses, with No LMP recorded.. Bowel function is normal. Bladder function is normal. Patient is not sexually active. Contraception method desired is IUD. Postpartum depression screening: negative. EPDS score = 0.   The following portions of the patient's history were reviewed and updated as appropriate: allergies, current medications, past family history, past medical history, past social history, past surgical history and problem list.  Review of Systems Pertinent items noted in HPI and remainder of comprehensive ROS otherwise negative.   Objective:    BP (!) 142/96   Pulse 78   Ht 5\' 5"  (1.651 m)   Wt 182 lb 12.8 oz (82.9 kg)   Breastfeeding Yes   BMI 30.42 kg/m   General:  alert and no distress   Breasts:  inspection negative, no nipple discharge or bleeding, no masses or nodularity palpable  Lungs: clear to auscultation bilaterally  Heart:  regular rate and rhythm, S1, S2 normal, no murmur, click, rub or gallop  Abdomen: soft, non-tender; bowel sounds normal; no masses,  no organomegaly.     Vulva:  normal  Vagina: normal vagina, no discharge, exudate, lesion, or erythema  Cervix:  no cervical motion tenderness and no lesions  Corpus: normal size, contour, position, consistency, mobility, non-tender  Adnexa:  normal adnexa and no mass, fullness, tenderness  Rectal Exam: Not performed.         Labs:  Lab Results    Component Value Date   HGB 9.0 (L) 07/01/2019     Assessment:   1. Postpartum care following vaginal delivery 2. Encounter for IUD insertion 3. Postpartum anemia 4. Elevated BP   Plan:   1. Contraception: IUD (see procedure note below) 2. Will check Hgb for h/o anemia. Currently asymptomatic 3. Elevated BP, patient with prior history of pre-eclampsia in a prior pregnancy, but no BP issues most recentpregnancy. Will f/u at IUD check and if still elevated, may need treatment. Otherwise asymptomatic.  4. Follow up in: 4 weeks for IUD check.       GYNECOLOGY OFFICE PROCEDURE NOTE  Marie Green is a 34 y.o. [phone] here for [place] IUD insertion. No GYN concerns.     IUD Insertion Procedure Note Patient identified, informed consent performed, consent signed.   Discussed risks of irregular bleeding, cramping, infection, malpositioning or misplacement of the IUD outside the uterus which may require further procedure such as laparoscopy. Also discussed >99% contraception efficacy, increased risk of ectopic pregnancy with failure of method.  Time out was performed.  Urine pregnancy test negative.  Speculum placed in the vagina.  Cervix visualized.  Cleaned with Betadine x 2.  Grasped anteriorly with a single tooth tenaculum.  Uterus sounded to 11 cm.  Liletta IUD placed per manufacturer's recommendations.  Strings trimmed to 3 cm. Tenaculum was removed, good hemostasis noted.  Patient tolerated procedure well.   Patient was given post-procedure instructions.  She was advised to have backup contraception for one week.  Patient was also asked to check IUD strings  periodically and follow up in 4 weeks for IUD check.   Lot: [id number] Exp: 09/2022   Hildred Laser, MD [facility]

## 2019-09-09 NOTE — Progress Notes (Deleted)
Pt present for her 4 week IUD string check up.

## 2019-09-10 ENCOUNTER — Encounter: Payer: Medicaid Other | Admitting: Obstetrics and Gynecology

## 2019-09-12 ENCOUNTER — Telehealth: Payer: Self-pay | Admitting: Obstetrics and Gynecology

## 2019-09-12 NOTE — Telephone Encounter (Signed)
Patient states she is feeling tired and her vagina has had an odor and has had vaginal bleeding since having her IUD inserted. Could you please advise?

## 2019-09-13 NOTE — Telephone Encounter (Signed)
Please advise. Thanks Kaileah Shevchenko 

## 2019-09-13 NOTE — Telephone Encounter (Signed)
Patient needs to come in to be seen.

## 2019-09-13 NOTE — Telephone Encounter (Signed)
Spoke with pt concerning her bleeding. Pt stated that she is changing her pad q 2-3 hours dark brown orange blood sometimes bright red with odor no clots. Pt stated that she has been weak and has been bleeding since her IUD has been inserted on 08/13/2019. Please advise. Thanks [employer]

## 2019-09-17 NOTE — Telephone Encounter (Signed)
Please call and schedule pt to see [facility] for follow up IUD check and prolonged bleeding with IUD. Thanks [employer]

## 2019-09-25 ENCOUNTER — Ambulatory Visit (INDEPENDENT_AMBULATORY_CARE_PROVIDER_SITE_OTHER): Payer: Medicaid Other | Admitting: Obstetrics and Gynecology

## 2019-09-25 ENCOUNTER — Encounter: Payer: Self-pay | Admitting: Obstetrics and Gynecology

## 2019-09-25 ENCOUNTER — Other Ambulatory Visit: Payer: Self-pay

## 2019-09-25 VITALS — BP 137/93 | HR 67 | Ht 65.0 in | Wt 180.6 lb

## 2019-09-25 DIAGNOSIS — R030 Elevated blood-pressure reading, without diagnosis of hypertension: Secondary | ICD-10-CM

## 2019-09-25 DIAGNOSIS — N921 Excessive and frequent menstruation with irregular cycle: Secondary | ICD-10-CM | POA: Diagnosis not present

## 2019-09-25 DIAGNOSIS — O99345 Other mental disorders complicating the puerperium: Secondary | ICD-10-CM | POA: Diagnosis not present

## 2019-09-25 DIAGNOSIS — F53 Postpartum depression: Secondary | ICD-10-CM

## 2019-09-25 DIAGNOSIS — F418 Other specified anxiety disorders: Secondary | ICD-10-CM

## 2019-09-25 DIAGNOSIS — Z30431 Encounter for routine checking of intrauterine contraceptive device: Secondary | ICD-10-CM | POA: Diagnosis not present

## 2019-09-25 DIAGNOSIS — Z975 Presence of (intrauterine) contraceptive device: Secondary | ICD-10-CM

## 2019-09-25 MED ORDER — ESCITALOPRAM OXALATE 10 MG PO TABS: 10.0000 mg | ORAL_TABLET | Freq: Every day | ORAL | 1 refills | Status: DC

## 2019-09-25 MED ORDER — ESTRADIOL 1 MG PO TABS: 1.0000 mg | ORAL_TABLET | Freq: Every day | ORAL | 1 refills | Status: DC

## 2019-09-25 NOTE — Progress Notes (Signed)
Pt present for IUD string check, prolonged bleeding with IUD and depression. Pt stated that she has noticed an increase in her cycles sine she had the IUD inserted. Pt c/o of small clots with mild pain. Pt aslo stated about having some depression and anxiety. GAD-7=14 EPDS=13

## 2019-09-25 NOTE — Patient Instructions (Signed)
Perinatal Anxiety When a woman feels excessive tension or worry (anxiety) during pregnancy or during the first 12 months after she gives birth, she has a condition called perinatal anxiety. Anxiety can interfere with work, school, relationships, and other everyday activities. If it is not managed properly, it can also cause problems in the mother and her baby.  If you are pregnant and you have symptoms of an anxiety disorder, it is important to talk with your health care provider. What are the causes? The exact cause of this condition is not known. Hormonal changes during and after pregnancy may play a role in causing perinatal anxiety. What increases the risk? You are more likely to develop this condition if:  You have a personal or family history of depression, anxiety, or mood disorders.  You experience a stressful life event during pregnancy, such as the death of a loved one.  You have a lot of regular life stress, such as being a single parent.  You have thyroid problems. What are the signs or symptoms? Perinatal anxiety can be different for everyone. It may include:  Panic attacks (panic disorder). These are intense episodes of fear or discomfort that may also cause sweating, nausea, shortness of breath, or fear of dying. They usually last 5-15 minutes.  Reliving an upsetting (traumatic) event through distressing thoughts, dreams, or flashbacks (post-traumatic stress disorder, or PTSD).  Excessive worry about multiple problems (generalized anxiety disorder).  Fear and stress about leaving certain people or loved ones (separation anxiety).  Performing repetitive tasks (compulsions) to relieve stress or worry (obsessive compulsive disorder, or OCD).  Fear of certain objects or situations (phobias).  Excessive worrying, such as a constant feeling that something bad is going to happen.  Inability to relax.  Difficulty concentrating.  Sleep problems.  Frequent nightmares or  disturbing thoughts. How is this diagnosed? This condition is diagnosed based on a physical exam and mental evaluation. In some cases, your health care provider may use an anxiety screening tool. These tools include a list of questions that can help a health care provider diagnose anxiety. Your health care provider may refer you to a mental health expert who specializes in anxiety. How is this treated? This condition may be treated with:  Medicines. Your health care provider will only give you medicines that have been proven safe for pregnancy and breastfeeding.  Talk therapy with a mental health professional to help change your patterns of thinking (cognitive behavioral therapy).  Mindfulness-based stress reduction.  Other relaxation therapies, such as deep breathing or guided muscle relaxation.  Support groups. Follow these instructions at home: Lifestyle  Do not use any products that contain nicotine or tobacco, such as cigarettes and e-cigarettes. If you need help quitting, ask your health care provider.  Do not use alcohol when you are pregnant. After your baby is born, limit alcohol intake to no more than 1 drink a day. One drink equals 12 oz of beer, 5 oz of wine, or 1 oz of hard liquor.  Consider joining a support group for new mothers. Ask your health care provider for recommendations.  Take good care of yourself. Make sure you: ? Get plenty of sleep. If you are having trouble sleeping, talk with your health care provider. ? Eat a healthy diet. This includes plenty of fruits and vegetables, whole grains, and lean proteins. ? Exercise regularly, as told by your health care provider. Ask your health care provider what exercises are safe for you. General instructions  Take over-the-counter  and prescription medicines only as told by your health care provider.  Talk with your partner or family members about your feelings during pregnancy. Share any concerns or fears that you may  have.  Ask for help with tasks or chores when you need it. Ask friends and family members to provide meals, watch your children, or help with cleaning.  Keep all follow-up visits as told by your health care provider. This is important. Contact a health care provider if:  You (or people close to you) notice that you have any symptoms of anxiety or depression.  You have anxiety and your symptoms get worse.  You experience side effects from medicines, such as nausea or sleep problems. Get help right away if:  You feel like hurting yourself, your baby, or someone else. If you ever feel like you may hurt yourself or others, or have thoughts about taking your own life, get help right away. You can go to your nearest emergency department or call:  Your local emergency services (911 in the [place]  A suicide crisis helpline, such as the National Suicide Prevention Lifeline at 1-800-273-8255. This is open 24 hours a day. Summary  Perinatal anxiety is when a woman feels excessive tension or worry during pregnancy or during the first 12 months after she gives birth.  Perinatal anxiety may include panic attacks, post-traumatic stress disorder, separation anxiety, phobias, or generalized anxiety.  Perinatal anxiety can cause physical health problems in the mother and baby if not properly managed.  This condition is treated with medicines, talk therapy, stress reduction therapies, or a combination of two or more treatments.  Talk with your partner or family members about your concerns or fears. Do not be afraid to ask for help. This information is not intended to replace advice given to you by your health care provider. Make sure you discuss any questions you have with your health care provider. Document Revised: 05/05/2017 Document Reviewed: 06/29/2016 Elsevier Patient Education  2020 [employer] Perinatal Depression When a woman feels excessive sadness, anger, or anxiety during pregnancy or  during the first 12 months after she gives birth, she has a condition called perinatal depression. Depression can interfere with work, school, relationships, and other everyday activities. If it is not managed properly, it can also cause problems in the mother and her baby. Sometimes, perinatal depression is left untreated because symptoms are thought to be normal mood swings during and right after pregnancy. If you have symptoms of depression, it is important to talk with your health care provider. What are the causes? The exact cause of this condition is not known. Hormonal changes during and after pregnancy may play a role in causing perinatal depression. What increases the risk? You are more likely to develop this condition if:  You have a personal or family history of depression, anxiety, or mood disorders.  You experience a stressful life event during pregnancy, such as the death of a loved one.  You have a lot of regular life stress.  You do not have support from family members or loved ones, or you are in an abusive relationship. What are the signs or symptoms? Symptoms of this condition include:  Feeling sad or hopeless.  Feelings of guilt.  Feeling irritable or overwhelmed.  Changes in your appetite.  Lack of energy or motivation.  Sleep problems.  Difficulty concentrating or completing tasks.  Loss of interest in hobbies or relationships.  Headaches or stomach problems that do not go away. How is   this diagnosed? This condition is diagnosed based on a physical exam and mental evaluation. In some cases, your health care provider may use a depression screening tool. These tools include a list of questions that can help a health care provider diagnose depression. Your health care provider may refer you to a mental health expert who specializes in depression. How is this treated? This condition may be treated with:  Medicines. Your health care provider will only give you  medicines that have been proven safe for pregnancy and breastfeeding.  Talk therapy with a mental health professional to help change your patterns of thinking (cognitive behavioral therapy).  Support groups.  Brain stimulation or light therapies.  Stress reduction therapies, such as mindfulness. Follow these instructions at home: Lifestyle  Do not use any products that contain nicotine or tobacco, such as cigarettes and e-cigarettes. If you need help quitting, ask your health care provider.  Do not use alcohol when you are pregnant. After your baby is born, limit alcohol intake to no more than 1 drink a day. One drink equals 12 oz of beer, 5 oz of wine, or 1 oz of hard liquor.  Consider joining a support group for new mothers. Ask your health care provider for recommendations.  Take good care of yourself. Make sure you: ? Get plenty of sleep. If you are having trouble sleeping, talk with your health care provider. ? Eat a healthy diet. This includes plenty of fruits and vegetables, whole grains, and lean proteins. ? Exercise regularly, as told by your health care provider. Ask your health care provider what exercises are safe for you. General instructions  Take over-the-counter and prescription medicines only as told by your health care provider.  Talk with your partner or family members about your feelings during pregnancy. Share any concerns or anxieties that you may have.  Ask for help with tasks or chores when you need it. Ask friends and family members to provide meals, watch your children, or help with cleaning.  Keep all follow-up visits as told by your health care provider. This is important. Contact a health care provider if:  You (or people close to you) notice that you have any symptoms of depression.  You have depression and your symptoms get worse.  You experience side effects from medicines, such as nausea or sleep problems. Get help right away if:  You feel  like hurting yourself, your baby, or someone else. If you ever feel like you may hurt yourself or others, or have thoughts about taking your own life, get help right away. You can go to your nearest emergency department or call:  Your local emergency services (911 in the [place]  A suicide crisis helpline, such as the National Suicide Prevention Lifeline at 1-800-273-8255. This is open 24 hours a day. Summary  Perinatal depression is when a woman feels excessive sadness, anger, or anxiety during pregnancy or during the first 12 months after she gives birth.  If perinatal depression is not treated, it can lead to health problems for the mother and her baby.  This condition is treated with medicines, talk therapy, stress reduction therapies, or a combination of two or more treatments.  Talk with your partner or family members about your feelings. Do not be afraid to ask for help. This information is not intended to replace advice given to you by your health care provider. Make sure you discuss any questions you have with your health care provider. Document Revised: 10/17/2018 Document Reviewed: 06/29/2016   Elsevier Patient Education  2020 Elsevier Inc.  
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
## 2019-09-27 NOTE — Addendum Note (Signed)
Addended by: Fabian November on: 09/27/2019 08:30 PM   Modules accepted: Level of Service

## 2019-10-23 ENCOUNTER — Encounter: Payer: Medicaid Other | Admitting: Obstetrics and Gynecology
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
## 2019-12-10 NOTE — Progress Notes (Deleted)
Pt present for annual exam. Pt stated  

## 2019-12-11 ENCOUNTER — Encounter: Payer: Medicaid Other | Admitting: Obstetrics and Gynecology

## 2019-12-16 ENCOUNTER — Encounter: Payer: Self-pay | Admitting: Obstetrics and Gynecology

## 2020-03-16 NOTE — Progress Notes (Signed)
Pt present for lump on left breast. Pt stated noticing the pain in the left breast since 02/26/2020. Pt stated that she could feel the pain when she lifted objects and at time could not lift/carry her child.  Pt stated pain/lump has decreased over time.

## 2020-03-16 NOTE — Patient Instructions (Signed)

## 2020-03-17 ENCOUNTER — Encounter: Payer: Self-pay | Admitting: Obstetrics and Gynecology

## 2020-03-17 ENCOUNTER — Ambulatory Visit (INDEPENDENT_AMBULATORY_CARE_PROVIDER_SITE_OTHER): Payer: Medicaid Other | Admitting: Obstetrics and Gynecology

## 2020-03-17 ENCOUNTER — Other Ambulatory Visit (HOSPITAL_COMMUNITY)
Admission: RE | Admit: 2020-03-17 | Discharge: 2020-03-17 | Disposition: A | Discharge status: Home / Self Care | Payer: Medicaid Other | Source: Ambulatory Visit | Attending: Obstetrics and Gynecology | Admitting: Obstetrics and Gynecology

## 2020-03-17 ENCOUNTER — Other Ambulatory Visit: Payer: Self-pay

## 2020-03-17 VITALS — BP 119/80 | HR 82 | Ht 65.0 in | Wt 175.6 lb

## 2020-03-17 DIAGNOSIS — Z30431 Encounter for routine checking of intrauterine contraceptive device: Secondary | ICD-10-CM | POA: Diagnosis not present

## 2020-03-17 DIAGNOSIS — Z124 Encounter for screening for malignant neoplasm of cervix: Secondary | ICD-10-CM | POA: Diagnosis not present

## 2020-03-17 DIAGNOSIS — N644 Mastodynia: Secondary | ICD-10-CM | POA: Diagnosis not present

## 2020-03-17 NOTE — Addendum Note (Signed)
Addended by: Silvano Bilis on: 03/17/2020 04:17 PM   Modules accepted: Orders

## 2020-03-17 NOTE — Progress Notes (Signed)
    GYNECOLOGY PROGRESS NOTE  Subjective:    Patient ID: Marie Green, female    DOB: April 28, 1986, 34 y.o.   MRN: [id number]  HPI  Patient is a 34 y.o. [phone] female who presents for complaints of a knot on her left breast. Noted that it caused pain when taking a deep breath or lifting arms above her head.  Lasted for about a week, but is now gone. Denies nipple discharge.   Patient of note is overdue for her pap smear.  Had Liletta IUD placed ~ 7 months ago but did not f/u for string check with pap smear scheduled.   The following portions of the patient's history were reviewed and updated as appropriate: allergies, current medications, past family history, past medical history, past social history, past surgical history and problem list.  Review of Systems Pertinent items noted in HPI and remainder of comprehensive ROS otherwise negative.   Objective:   Blood pressure 119/80, pulse 82, height 5\' 5"  (1.651 m), weight 175 lb 9.6 oz (79.7 kg), last menstrual period 01/28/2020, not currently breastfeeding. General appearance: alert and no distress Pelvic: external genitalia normal, rectovaginal septum normal.  Vagina with small amount of discharge.  Cervix normal appearing, no lesions and no motion tenderness. IUD threads visible, ~ 3 cm.  Uterus mobile, nontender, normal shape and size.  Adnexae non-palpable, nontender bilaterally.     Assessment:   1. IUD check up   2. Encounter for Papanicolaou smear of cervix     Plan:   1. IUD in place. Doing well. Can keep in place for up to 7 years, or remove sooner if any concerns or pregnancy desired.  2. Pap smear done today.  Will notify in Mychart of results.   01/30/2020, MD [facility]

## 2020-03-23 LAB — CYTOLOGY - PAP
Comment: NEGATIVE
Diagnosis: NEGATIVE
Diagnosis: REACTIVE
High risk HPV: NEGATIVE

## 2020-05-28 ENCOUNTER — Encounter: Payer: Medicaid Other | Admitting: Obstetrics and Gynecology

## 2020-06-30 NOTE — Patient Instructions (Incomplete)

## 2020-07-01 ENCOUNTER — Encounter: Payer: Medicaid Other | Admitting: Obstetrics and Gynecology

## 2020-07-01 DIAGNOSIS — Z01419 Encounter for gynecological examination (general) (routine) without abnormal findings: Secondary | ICD-10-CM

## 2021-08-26 ENCOUNTER — Other Ambulatory Visit: Payer: Self-pay

## 2021-08-26 ENCOUNTER — Emergency Department (HOSPITAL_BASED_OUTPATIENT_CLINIC_OR_DEPARTMENT_OTHER)
Admission: EM | Admit: 2021-08-26 | Discharge: 2021-08-26 | Disposition: A | Discharge status: Home / Self Care | Payer: Medicaid Other | Attending: Emergency Medicine | Admitting: Emergency Medicine

## 2021-08-26 DIAGNOSIS — I10 Essential (primary) hypertension: Secondary | ICD-10-CM | POA: Diagnosis not present

## 2021-08-26 DIAGNOSIS — J020 Streptococcal pharyngitis: Secondary | ICD-10-CM | POA: Insufficient documentation

## 2021-08-26 DIAGNOSIS — J029 Acute pharyngitis, unspecified: Secondary | ICD-10-CM | POA: Diagnosis present

## 2021-08-26 LAB — GROUP A STREP BY PCR: Group A Strep by PCR: DETECTED — AB

## 2021-08-26 MED ORDER — AZITHROMYCIN 250 MG PO TABS
500.0000 mg | ORAL_TABLET | Freq: Once | ORAL | Status: AC
  Administered 2021-08-26: 500 mg via ORAL
  Filled 2021-08-26: qty 2

## 2021-08-26 MED ORDER — AZITHROMYCIN 250 MG PO TABS: 250.0000 mg | ORAL_TABLET | Freq: Every day | ORAL | 0 refills | Status: DC

## 2021-08-26 NOTE — Discharge Instructions (Addendum)
You are seen in the emergency department today for sore throat. ? ?As we discussed you tested positive for strep.  We will treat with antibiotics.  Important you finish the entire course.  You can take ibuprofen or Tylenol as needed for fever or pain. ? ?Continue to monitor how you're doing and return to the ER for new or worsening symptoms.  ?

## 2021-08-26 NOTE — ED Triage Notes (Signed)
Pt c/o sore throat and cough onset yesterday. Exposure to strep throat.  ?

## 2021-08-27 NOTE — ED Provider Notes (Signed)
?[facility] EM[facility] DEPT ?Provider Note ? ? ?CSN: [id number] ?Arrival date & time: 08/26/21  1851 ? ?  ? ?History ? ?Chief Complaint  ?Patient presents with  ? Sore Throat  ? ? ?Marie Green is a 36 y.o. female who presents to the emergency department with complaint of sore throat and cough starting yesterday.  States that she was exposed to somebody with strep throat.  She is here with her daughter who has similar symptoms.  No fever.  She has not taken anything for her symptoms. ? ? ?Sore Throat ?Pertinent negatives include no chest pain and no shortness of breath.  ? ?  ? ?Home Medications ?Prior to Admission medications   ?Medication Sig Start Date End Date Taking? Authorizing Provider  ?azithromycin (ZITHROMAX) 250 MG tablet Take 1 tablet (250 mg total) by mouth daily. 08/26/21  Yes Dovber Ernest T, PA-C  ?escitalopram (LEXAPRO) 10 MG tablet Take 1 tablet (10 mg total) by mouth daily. ?Patient not taking: Reported on 03/17/2020 09/25/19   Hildred Laser, MD  ?estradiol (ESTRACE) 1 MG tablet Take 1 tablet (1 mg total) by mouth daily. ?Patient not taking: Reported on 03/17/2020 09/25/19   Hildred Laser, MD  ?ibuprofen (ADVIL) 800 MG tablet Take 1 tablet (800 mg total) by mouth every 8 (eight) hours as needed. ?Patient not taking: Reported on 03/17/2020 07/01/19   Hildred Laser, MD  ?levonorgestrel (LILETTA, 52 MG,) 19.5 MCG/DAY IUD IUD 1 each by Intrauterine route once. Insertion date 08/13/2019 [facility]    [provider]  ?   ? ?Allergies    ?Mushroom extract complex, Amoxicillin, and Flagyl [metronidazole]   ? ?Review of Systems   ?Review of Systems  ?Constitutional:  Negative for fever.  ?HENT:  Positive for congestion and sore throat.   ?Respiratory:  Positive for cough. Negative for shortness of breath.   ?Cardiovascular:  Negative for chest pain.  ?All other systems reviewed and are negative. ? ?Physical Exam ?Updated Vital Signs ?BP 122/88   Pulse 82   Temp 99.5 ?F (37.5 ?C) (Oral)   Resp  18   Ht 5\' 5"  (1.651 m)   Wt 79.8 kg   SpO2 100%   BMI 29.29 kg/m?  ?Physical Exam ?Vitals and nursing note reviewed.  ?Constitutional:   ?   Appearance: Normal appearance.  ?HENT:  ?   Head: Normocephalic and atraumatic.  ?   Mouth/Throat:  ?   Lips: Pink.  ?   Mouth: Mucous membranes are moist.  ?   Pharynx: Posterior oropharyngeal erythema present.  ?   Tonsils: Tonsillar exudate present. No tonsillar abscesses. 1+ on the right. 1+ on the left.  ?Eyes:  ?   Conjunctiva/sclera: Conjunctivae normal.  ?Cardiovascular:  ?   Rate and Rhythm: Normal rate and regular rhythm.  ?Pulmonary:  ?   Effort: Pulmonary effort is normal. No respiratory distress.  ?   Breath sounds: Normal breath sounds.  ?Abdominal:  ?   General: There is no distension.  ?   Palpations: Abdomen is soft.  ?   Tenderness: There is no abdominal tenderness.  ?Skin: ?   General: Skin is warm and dry.  ?Neurological:  ?   General: No focal deficit present.  ?   Mental Status: She is alert.  ? ? ?ED Results / Procedures / Treatments   ?Labs ?(all labs ordered are listed, but only abnormal results are displayed) ?Labs Reviewed  ?GROUP A STREP BY PCR - Abnormal; Notable for the following components:  ?  Result Value  ? Group A Strep by PCR DETECTED (*)   ? All other components within normal limits  ? ? ?EKG ?None ? ?Radiology ?No results found. ? ?Procedures ?Procedures  ? ? ?Medications Ordered in ED ?Medications  ?azithromycin (ZITHROMAX) tablet 500 mg (500 mg Oral Given 08/26/21 2138)  ? ? ?ED Course/ Medical Decision Making/ A&P ?  ?                        ?Medical Decision Making ?Risk ?Prescription drug management. ? ? ?This patient is a 36 year old female who presents to the ED for concern of sore throat.  ? ?Differential diagnoses prior to evaluation: ?The emergent differential diagnosis includes, but is not limited to,  Viral pharyngitis, strep pharyngitis, dental caries/abscess, esophagitis, sinusitis, post nasal drip, reflux, angioedema,  RTA/PTA, Ludwig's angina.  This is not an exhaustive differential.  ? ?Past Medical History / Co-morbidities: ?Hypertension, migraines, gestational diabetes, preeclampsia ? ?Physical Exam: ?Physical exam performed. The pertinent findings include: Patient is afebrile, not tachycardic, not hypoxic, no acute distress.  Oropharyngeal erythema with bilateral tonsillar swelling and exudate.  No abscess. ? ?Lab Tests/Imaging studies: ?I Ordered, and personally interpreted labs/imaging including group A strep testing.  The pertinent results include: Positive for strep.  ?  ?Medications: ?I ordered medication including antibiotics for strep throat.  I have reviewed the patients home medicines and have made adjustments as needed. ?  ?Disposition: ?After consideration of the diagnostic results and the patients response to treatment, I feel that patient is requiring admission or inpatient treatment for her symptoms.  We will treat with a course of antibiotics and encouraged her to follow-up with her PCP if her symptoms persist.. Discussed reasons to return to the emergency department, and the patient is agreeable to the plan.  ?Final Clinical Impression(s) / ED Diagnoses ?Final diagnoses:  ?Strep pharyngitis  ? ? ?Rx / DC Orders ?ED Discharge Orders   ? ?      Ordered  ?  azithromycin (ZITHROMAX) 250 MG tablet  Daily       ? 08/26/21 2130  ? ?  ?  ? ?  ? ?Portions of this report may have been transcribed using voice recognition software. Every effort was made to ensure accuracy; however, inadvertent computerized transcription errors may be present. ? ?  ?Su Monks, PA-C ?08/27/21 1254 ? ?  ?Charlynne Pander, MD ?08/28/21 1454 ? ?
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
## 2022-02-12 ENCOUNTER — Encounter (HOSPITAL_COMMUNITY): Payer: Self-pay

## 2022-02-12 ENCOUNTER — Other Ambulatory Visit: Payer: Self-pay

## 2022-02-12 ENCOUNTER — Emergency Department (HOSPITAL_COMMUNITY)
Admission: EM | Admit: 2022-02-12 | Discharge: 2022-02-12 | Disposition: A | Discharge status: Home / Self Care | Payer: Medicaid Other | Attending: Emergency Medicine | Admitting: Emergency Medicine

## 2022-02-12 ENCOUNTER — Emergency Department (HOSPITAL_COMMUNITY): Payer: Medicaid Other

## 2022-02-12 DIAGNOSIS — M25511 Pain in right shoulder: Secondary | ICD-10-CM | POA: Diagnosis present

## 2022-02-12 DIAGNOSIS — Z79899 Other long term (current) drug therapy: Secondary | ICD-10-CM | POA: Diagnosis not present

## 2022-02-12 DIAGNOSIS — I10 Essential (primary) hypertension: Secondary | ICD-10-CM | POA: Insufficient documentation

## 2022-02-12 MED ORDER — METHOCARBAMOL 500 MG PO TABS
500.0000 mg | ORAL_TABLET | Freq: Once | ORAL | Status: AC
  Administered 2022-02-12: 500 mg via ORAL
  Filled 2022-02-12: qty 1

## 2022-02-12 MED ORDER — KETOROLAC TROMETHAMINE 30 MG/ML IJ SOLN
30.0000 mg | Freq: Once | INTRAMUSCULAR | Status: AC
  Administered 2022-02-12: 30 mg via INTRAMUSCULAR
  Filled 2022-02-12: qty 1

## 2022-02-12 MED ORDER — METHOCARBAMOL 500 MG PO TABS: 500.0000 mg | ORAL_TABLET | Freq: Two times a day (BID) | ORAL | 0 refills | Status: AC

## 2022-02-12 MED ORDER — NAPROXEN 500 MG PO TABS: 500.0000 mg | ORAL_TABLET | Freq: Two times a day (BID) | ORAL | 0 refills | Status: AC

## 2022-02-12 NOTE — ED Provider Notes (Signed)
Marie Green   CSN: [id number] Arrival date & time: 02/12/22  2031     History History of hypertension  Chief Complaint  Patient presents with   Shoulder Pain    Marie Green is a 36 y.o. female.  Patient presents the ED with right shoulder pain.  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  She says that she strained her right shoulder muscles when she did this.  She says anytime she lifts her arm a certain direction she gets shooting pains all the way down to her elbow.    She denies neck pain, numbness, or tingling.   Shoulder Pain      Home Medications Prior to Admission medications   Medication Sig Start Date End Date Taking? Authorizing Provider  azithromycin (ZITHROMAX) 250 MG tablet Take 1 tablet (250 mg total) by mouth daily. 08/26/21   Roemhildt, Lorin T, PA-C  escitalopram (LEXAPRO) 10 MG tablet Take 1 tablet (10 mg total) by mouth daily. Patient not taking: Reported on 03/17/2020 09/25/19   Rubie Maid, MD  estradiol (ESTRACE) 1 MG tablet Take 1 tablet (1 mg total) by mouth daily. Patient not taking: Reported on 03/17/2020 09/25/19   Rubie Maid, MD  ibuprofen (ADVIL) 800 MG tablet Take 1 tablet (800 mg total) by mouth every 8 (eight) hours as needed. Patient not taking: Reported on 03/17/2020 07/01/19   Rubie Maid, MD  levonorgestrel (LILETTA, 52 MG,) 19.5 MCG/DAY IUD IUD 1 each by Intrauterine route once. Insertion date 08/13/2019 [facility]    [provider]  methocarbamol (ROBAXIN) 500 MG tablet Take 1 tablet (500 mg total) by mouth 2 (two) times daily for 7 days. 02/12/22 02/19/22 Yes Sayer Masini, Adora Fridge, PA-C  naproxen (NAPROSYN) 500 MG tablet Take 1 tablet (500 mg total) by mouth 2 (two) times daily for 7 days. 02/12/22 02/19/22 Yes Jalena Vanderlinden, Adora Fridge, PA-C      Allergies    Mushroom extract complex, Amoxicillin, and Flagyl [metronidazole]    Review of Systems   Review of  Systems  Musculoskeletal:  Positive for arthralgias.  All other systems reviewed and are negative.   Physical Exam Updated Vital Signs BP (!) 138/94 (BP Location: Left Arm)   Pulse 68   Temp 98.3 F (36.8 C) (Oral)   Resp 18   Ht 5\' 5"  (1.651 m)   Wt 75.8 kg   SpO2 98%   BMI 27.79 kg/m  Physical Exam Vitals and nursing Green reviewed.  Constitutional:      General: She is not in acute distress.    Appearance: Normal appearance. She is well-developed. She is not ill-appearing, toxic-appearing or diaphoretic.  HENT:     Head: Normocephalic and atraumatic.     Nose: No nasal deformity.     Mouth/Throat:     Lips: Pink. No lesions.  Eyes:     General: Gaze aligned appropriately. No scleral icterus.       Right eye: No discharge.        Left eye: No discharge.     Conjunctiva/sclera: Conjunctivae normal.     Right eye: Right conjunctiva is not injected. No exudate or hemorrhage.    Left eye: Left conjunctiva is not injected. No exudate or hemorrhage. Pulmonary:     Effort: Pulmonary effort is normal. No respiratory distress.  Musculoskeletal:     Comments: No cervical midline tenderness or step-offs noted.  Reproducible right-sided paracervical muscular tenderness overlying  the right scapula.  Range of motion of the right shoulder is limited and she cannot lift above her head.  She has no pain or range of motion abnormalities of the right elbow or right wrist.  She has a 2+ radial pulse bilaterally.  She has sensation equal and intact bilaterally.  She has no motor weakness in upper extremities.  Skin:    General: Skin is warm and dry.  Neurological:     Mental Status: She is alert and oriented to person, place, and time.  Psychiatric:        Mood and Affect: Mood normal.        Speech: Speech normal.        Behavior: Behavior normal. Behavior is cooperative.     ED Results / Procedures / Treatments   Labs (all labs ordered are listed, but only abnormal results are  displayed) Labs Reviewed - No data to display  EKG None  Radiology DG Shoulder Right Port  Result Date: 02/12/2022 CLINICAL DATA:  Right shoulder pain. EXAM: RIGHT SHOULDER - 1 VIEW COMPARISON:  None Available. FINDINGS: There is no evidence of fracture or dislocation. There is no evidence of arthropathy or other focal bone abnormality. Soft tissues are unremarkable. IMPRESSION: Negative. Electronically Signed   By: Aram Candela M.D.   On: 02/12/2022 21:38    Procedures Procedures   Medications Ordered in ED Medications  ketorolac (TORADOL) 30 MG/ML injection 30 mg (has no administration in time range)  methocarbamol (ROBAXIN) tablet 500 mg (has no administration in time range)    ED Course/ Medical Decision Making/ A&P                           Medical Decision Making Amount and/or Complexity of Data Reviewed Radiology: ordered.  Risk Prescription drug management.   Patient is here with right shoulder pain after lifting a patient yesterday.  Exam is concerning for right shoulder impingement.  We did obtain a shoulder x-ray which was negative for any acute abnormalities.  She is neurovascularly intact on exam.  I have given her Toradol injection as well as Robaxin here in the ED.  I recommended that she follow-up with orthopedics outpatient for possible MRI treatment.   Final Clinical Impression(s) / ED Diagnoses Final diagnoses:  Acute pain of right shoulder    Rx / DC Orders ED Discharge Orders          Ordered    methocarbamol (ROBAXIN) 500 MG tablet  2 times daily        02/12/22 2257    naproxen (NAPROSYN) 500 MG tablet  2 times daily        02/12/22 2257              Claudie Leach, PA-C 02/12/22 2258    Wynetta Fines, MD 02/12/22 2328

## 2022-02-12 NOTE — ED Triage Notes (Signed)
Patient reports right shoulder pain after assisting a patient at work. Patient reports pain 10/10. Patient took tylenol on Friday.

## 2022-02-12 NOTE — Discharge Instructions (Signed)
Your x-ray today was negative for acute fracture or dislocation.  Suspect that you have a rotator cuff injury that is causing impingement on one of the your nerves going down to your arms.  I have prescribed you a muscle relaxer called Robaxin.  Please do not drive or operate heavy machinery after using this as it may make you drowsy.  I have also prescribed you naproxen that he can take as prescribed.  Please call the orthopedic doctor that I put in your paperwork to schedule follow-up appointment and consideration of further imaging of your shoulder.

## 2022-02-28 ENCOUNTER — Encounter: Payer: Self-pay | Admitting: Obstetrics and Gynecology

## 2022-05-13 ENCOUNTER — Encounter (HOSPITAL_COMMUNITY): Payer: Self-pay | Admitting: Emergency Medicine

## 2022-05-13 ENCOUNTER — Ambulatory Visit (HOSPITAL_COMMUNITY)
Admission: EM | Admit: 2022-05-13 | Discharge: 2022-05-13 | Disposition: A | Discharge status: Home / Self Care | Payer: Medicaid Other | Attending: Emergency Medicine | Admitting: Emergency Medicine

## 2022-05-13 DIAGNOSIS — B349 Viral infection, unspecified: Secondary | ICD-10-CM | POA: Diagnosis not present

## 2022-05-13 DIAGNOSIS — R112 Nausea with vomiting, unspecified: Secondary | ICD-10-CM | POA: Diagnosis not present

## 2022-05-13 MED ORDER — ONDANSETRON 4 MG PO TBDP
ORAL_TABLET | ORAL | Status: AC
  Filled 2022-05-13: qty 1

## 2022-05-13 MED ORDER — ACETAMINOPHEN 325 MG PO TABS: 650.0000 mg | ORAL_TABLET | Freq: Once | ORAL | Status: DC

## 2022-05-13 MED ORDER — GUAIFENESIN ER 600 MG PO TB12: 600.0000 mg | ORAL_TABLET | Freq: Two times a day (BID) | ORAL | 0 refills | Status: AC

## 2022-05-13 MED ORDER — ACETAMINOPHEN 325 MG PO TABS
325.0000 mg | ORAL_TABLET | Freq: Once | ORAL | Status: AC
  Administered 2022-05-13: 325 mg via ORAL

## 2022-05-13 MED ORDER — ACETAMINOPHEN 325 MG PO TABS
ORAL_TABLET | ORAL | Status: AC
  Filled 2022-05-13: qty 2

## 2022-05-13 MED ORDER — ONDANSETRON 4 MG PO TBDP
4.0000 mg | ORAL_TABLET | Freq: Once | ORAL | Status: AC
  Administered 2022-05-13: 4 mg via ORAL

## 2022-05-13 MED ORDER — ALBUTEROL SULFATE HFA 108 (90 BASE) MCG/ACT IN AERS: 2.0000 | INHALATION_SPRAY | Freq: Four times a day (QID) | RESPIRATORY_TRACT | 0 refills | Status: AC | PRN

## 2022-05-13 MED ORDER — ONDANSETRON HCL 4 MG PO TABS: 4.0000 mg | ORAL_TABLET | Freq: Four times a day (QID) | ORAL | 0 refills | Status: DC | PRN

## 2022-05-13 NOTE — Discharge Instructions (Addendum)
You can take the Zofran every 6 hours for nausea Make sure you are drinking lots of fluids over the next several days  Tylenol can be used for body aches and muscle pain  I recommend Mucinex for cough and congestion. Use your inhaler for cough and chest tightness as needed  At night you can take Benadryl which may help you sleep
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
## 2022-05-13 NOTE — ED Triage Notes (Signed)
Pt reports since Christmas having body aches and cough. Rib pains from coughing. Denies cough being productive. Having headaches and decreased appetite as well as vomiting green and yellow liquid. Hasn't taken any thing since Christmas night-took Nyquil

## 2022-09-30 NOTE — Progress Notes (Unsigned)
    GYNECOLOGY OFFICE PROCEDURE NOTE  Marie Green is a 37 y.o. [phone] here for Liletta IUD removal. No GYN concerns.  Last pap smear was on 03/17/2020 and was normal.  IUD Removal  Patient identified, informed consent performed, consent signed.  Patient was in the dorsal lithotomy position, normal external genitalia was noted.  A speculum was placed in the patient's vagina, normal discharge was noted, no lesions. The cervix was visualized, no lesions, no abnormal discharge.  The strings of the IUD were grasped and pulled using ring forceps. The IUD was removed in its entirety. *** (The strings of the IUD were not visualized, so Kelly forceps were introduced into the endometrial cavity and the IUD was grasped and removed in its entirety).  Patient tolerated the procedure well.    Patient will use *** for contraception/***plans for pregnancy soon and she was told to avoid teratogens, take PNV and folic acid.  Routine preventative health maintenance measures emphasized.    Hildred Laser, MD [facility]

## 2022-10-04 ENCOUNTER — Ambulatory Visit (INDEPENDENT_AMBULATORY_CARE_PROVIDER_SITE_OTHER): Payer: Medicaid Other | Admitting: Obstetrics and Gynecology

## 2022-10-04 ENCOUNTER — Encounter: Payer: Self-pay | Admitting: Obstetrics and Gynecology

## 2022-10-04 VITALS — BP 110/73 | HR 92 | Resp 16 | Ht 65.5 in | Wt 167.4 lb

## 2022-10-04 DIAGNOSIS — Z30432 Encounter for removal of intrauterine contraceptive device: Secondary | ICD-10-CM | POA: Diagnosis not present

## 2022-10-04 DIAGNOSIS — Z30013 Encounter for initial prescription of injectable contraceptive: Secondary | ICD-10-CM | POA: Diagnosis not present

## 2022-10-04 MED ORDER — MEDROXYPROGESTERONE ACETATE 150 MG/ML IM SUSP
150.0000 mg | Freq: Once | INTRAMUSCULAR | Status: AC
  Administered 2022-10-04: 150 mg via INTRAMUSCULAR

## 2022-10-04 NOTE — Patient Instructions (Signed)

## 2022-10-04 NOTE — Progress Notes (Signed)
Date last pap: 03/17/2020. Last Depo-Provera: First dose. Side Effects if any: None. Serum HCG indicated? N/A (IUD was taken out). Depo-Provera 150 mg IM given by: Santiago Bumpers, CMA. Next appointment due: Aug 6 and Aug 20.

## 2022-12-20 ENCOUNTER — Ambulatory Visit: Payer: Medicaid Other

## 2022-12-20 NOTE — Progress Notes (Deleted)
    NURSE VISIT NOTE  Subjective:    Patient ID: Marie Green, female    DOB: 01-16-1986, 37 y.o.   MRN: [id number]  HPI  Patient is a 37 y.o. [id number] female who presents for depo provera injection.   Objective:    There were no vitals taken for this visit.  Last Annual:03/17/20 Last pap: 03/17/20. Last Depo-Provera: 10/04/22. Side Effects if any: none***. Serum HCG indicated? No . Depo-Provera 150 mg IM given by: Cornelius Moras, CMA. Site: {AOB INJ [id number]  Lab Review  @THIS  VISIT ONLY@  Assessment:   No diagnosis found.   Plan:   Next appointment due between *** and ***.    Cornelius Moras, CMA

## 2022-12-20 NOTE — Progress Notes (Deleted)
.  aob
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
## 2023-05-02 NOTE — Patient Instructions (Signed)
Preventive Care 33-35 Years Old, Female  Preventive care refers to lifestyle choices and visits with your health care provider that can promote health and wellness. Preventive care visits are also called wellness exams. What can I expect for my preventive care visit? Counseling During your preventive care visit, your health care provider may ask about your: Medical history, including: Past medical problems. Family medical history. Pregnancy history. Current health, including: Menstrual cycle. Method of birth control. Emotional well-being. Home life and relationship well-being. Sexual activity and sexual health. Lifestyle, including: Alcohol, nicotine or tobacco, and drug use. Access to firearms. Diet, exercise, and sleep habits. Work and work Astronomer. Sunscreen use. Safety issues such as seatbelt and bike helmet use. Physical exam Your health care provider may check your: Height and weight. These may be used to calculate your BMI (body mass index). BMI is a measurement that tells if you are at a healthy weight. Waist circumference. This measures the distance around your waistline. This measurement also tells if you are at a healthy weight and may help predict your risk of certain diseases, such as type 2 diabetes and high blood pressure. Heart rate and blood pressure. Body temperature. Skin for abnormal spots. What immunizations do I need?  Vaccines are usually given at various ages, according to a schedule. Your health care provider will recommend vaccines for you based on your age, medical history, and lifestyle or other factors, such as travel or where you work. What tests do I need? Screening Your health care provider may recommend screening tests for certain conditions. This may include: Pelvic exam and Pap test. Lipid and cholesterol levels. Diabetes screening. This is done by checking your blood sugar (glucose) after you have not eaten for a while  (fasting). Hepatitis B test. Hepatitis C test. HIV (human immunodeficiency virus) test. STI (sexually transmitted infection) testing, if you are at risk. BRCA-related cancer screening. This may be done if you have a family history of breast, ovarian, tubal, or peritoneal cancers. Talk with your health care provider about your test results, treatment options, and if necessary, the need for more tests. Follow these instructions at home: Eating and drinking  Eat a healthy diet that includes fresh fruits and vegetables, whole grains, lean protein, and low-fat dairy products. Take vitamin and mineral supplements as recommended by your health care provider. Do not drink alcohol if: Your health care provider tells you not to drink. You are pregnant, may be pregnant, or are planning to become pregnant. If you drink alcohol: Limit how much you have to 0-1 drink a day. Know how much alcohol is in your drink. In the [place] one drink equals one 12 oz bottle of beer (355 mL), one 5 oz glass of wine (148 mL), or one 1 oz glass of hard liquor (44 mL). Lifestyle Brush your teeth every morning and night with fluoride toothpaste. Floss one time each day. Exercise for at least 30 minutes 5 or more days each week. Do not use any products that contain nicotine or tobacco. These products include cigarettes, chewing tobacco, and vaping devices, such as e-cigarettes. If you need help quitting, ask your health care provider. Do not use drugs. If you are sexually active, practice safe sex. Use a condom or other form of protection to prevent STIs. If you do not wish to become pregnant, use a form of birth control. If you plan to become pregnant, see your health care provider for a prepregnancy visit. Find healthy ways to manage stress, such as:  Meditation, yoga, or listening to music. Journaling. Talking to a trusted person. Spending time with friends and family. Minimize exposure to UV radiation to reduce your  risk of skin cancer. Safety Always wear your seat belt while driving or riding in a vehicle. Do not drive: If you have been drinking alcohol. Do not ride with someone who has been drinking. If you have been using any mind-altering substances or drugs. While texting. When you are tired or distracted. Wear a helmet and other protective equipment during sports activities. If you have firearms in your house, make sure you follow all gun safety procedures. Seek help if you have been physically or sexually abused. What's next? Go to your health care provider once a year for an annual wellness visit. Ask your health care provider how often you should have your eyes and teeth checked. Stay up to date on all vaccines. This information is not intended to replace advice given to you by your health care provider. Make sure you discuss any questions you have with your health care provider. Document Revised: 10/28/2020 Document Reviewed: 10/28/2020 Elsevier Patient Education  2024 Elsevier Inc.  Breast self-awareness is knowing how your breasts look and feel. You need to: Check your breasts on a regular basis. Tell your doctor about any changes. Become familiar with the look and feel of your breasts. This can help you catch a breast problem while it is still small and can be treated. You should do breast self-exams even if you have breast implants. What you need: A mirror. A well-lit room. A pillow or other soft object. How to do a breast self-exam Follow these steps to do a breast self-exam: Look for changes  Take off all the clothes above your waist. Stand in front of a mirror in a room with good lighting. Put your hands down at your sides. Compare your breasts in the mirror. Look for any difference between them, such as: A difference in shape. A difference in size. Wrinkles, dips, and bumps in one breast and not the other. Look at each breast for changes in the skin, such  as: Redness. Scaly areas. Skin that has gotten thicker. Dimpling. Open sores (ulcers). Look for changes in your nipples, such as: Fluid coming out of a nipple. Fluid around a nipple. Bleeding. Dimpling. Redness. A nipple that looks pushed in (retracted), or that has changed position. Feel for changes Lie on your back. Feel each breast. To do this: Pick a breast to feel. Place a pillow under the shoulder closest to that breast. Put the arm closest to that breast behind your head. Feel the nipple area of that breast using the hand of your other arm. Feel the area with the pads of your three middle fingers by making small circles with your fingers. Use light, medium, and firm pressure. Continue the overlapping circles, moving downward over the breast. Keep making circles with your fingers. Stop when you feel your ribs. Start making circles with your fingers again, this time going upward until you reach your collarbone. Then, make circles outward across your breast and into your armpit area. Squeeze your nipple. Check for discharge and lumps. Repeat these steps to check your other breast. Sit or stand in the tub or shower. With soapy water on your skin, feel each breast the same way you did when you were lying down. Write down what you find Writing down what you find can help you remember what to tell your doctor. Write down: What is  normal for each breast. Any changes you find in each breast. These include: The kind of changes you find. A tender or painful breast. Any lump you find. Write down its size and where it is. When you last had your monthly period (menstrual cycle). General tips If you are breastfeeding, the best time to check your breasts is after you feed your baby or after you use a breast pump. If you get monthly bleeding, the best time to check your breasts is 5-7 days after your monthly cycle ends. With time, you will become comfortable with the self-exam. You will  also start to know if there are changes in your breasts. Contact a doctor if: You see a change in the shape or size of your breasts or nipples. You see a change in the skin of your breast or nipples, such as red or scaly skin. You have fluid coming from your nipples that is not normal. You find a new lump or thick area. You have breast pain. You have any concerns about your breast health. Summary Breast self-awareness includes looking for changes in your breasts and feeling for changes within your breasts. You should do breast self-awareness in front of a mirror in a well-lit room. If you get monthly periods (menstrual cycles), the best time to check your breasts is 5-7 days after your period ends. Tell your doctor about any changes you see in your breasts. Changes include changes in size, changes on the skin, painful or tender breasts, or fluid from your nipples that is not normal. This information is not intended to replace advice given to you by your health care provider. Make sure you discuss any questions you have with your health care provider. Document Revised: 10/07/2021 Document Reviewed: 03/04/2021 Elsevier Patient Education  2024 [employer].

## 2023-05-04 ENCOUNTER — Ambulatory Visit (INDEPENDENT_AMBULATORY_CARE_PROVIDER_SITE_OTHER): Payer: Medicaid Other | Admitting: Obstetrics and Gynecology

## 2023-05-04 ENCOUNTER — Encounter: Payer: Self-pay | Admitting: Obstetrics and Gynecology

## 2023-05-04 ENCOUNTER — Other Ambulatory Visit (HOSPITAL_COMMUNITY)
Admission: RE | Admit: 2023-05-04 | Discharge: 2023-05-04 | Disposition: A | Discharge status: Home / Self Care | Payer: Medicaid Other | Source: Ambulatory Visit | Attending: Obstetrics and Gynecology | Admitting: Obstetrics and Gynecology

## 2023-05-04 VITALS — BP 104/91 | HR 83 | Ht 65.5 in | Wt 169.4 lb

## 2023-05-04 DIAGNOSIS — Z331 Pregnant state, incidental: Secondary | ICD-10-CM

## 2023-05-04 DIAGNOSIS — Z3201 Encounter for pregnancy test, result positive: Secondary | ICD-10-CM

## 2023-05-04 DIAGNOSIS — Z124 Encounter for screening for malignant neoplasm of cervix: Secondary | ICD-10-CM | POA: Insufficient documentation

## 2023-05-04 DIAGNOSIS — Z13220 Encounter for screening for lipoid disorders: Secondary | ICD-10-CM

## 2023-05-04 DIAGNOSIS — Z1159 Encounter for screening for other viral diseases: Secondary | ICD-10-CM

## 2023-05-04 DIAGNOSIS — Z1151 Encounter for screening for human papillomavirus (HPV): Secondary | ICD-10-CM | POA: Diagnosis present

## 2023-05-04 DIAGNOSIS — Z01419 Encounter for gynecological examination (general) (routine) without abnormal findings: Secondary | ICD-10-CM | POA: Diagnosis not present

## 2023-05-04 DIAGNOSIS — N912 Amenorrhea, unspecified: Secondary | ICD-10-CM

## 2023-05-04 DIAGNOSIS — Z131 Encounter for screening for diabetes mellitus: Secondary | ICD-10-CM

## 2023-05-04 LAB — POCT URINE PREGNANCY: Preg Test, Ur: POSITIVE — AB

## 2023-05-05 LAB — COMPREHENSIVE METABOLIC PANEL
ALT: 15 [IU]/L (ref 0–32)
AST: 13 [IU]/L (ref 0–40)
Albumin: 4.3 g/dL (ref 3.9–4.9)
Alkaline Phosphatase: 55 [IU]/L (ref 44–121)
BUN/Creatinine Ratio: 9 (ref 9–23)
BUN: 7 mg/dL (ref 6–20)
Bilirubin Total: 0.9 mg/dL (ref 0.0–1.2)
CO2: 21 mmol/L (ref 20–29)
Calcium: 9.2 mg/dL (ref 8.7–10.2)
Chloride: 104 mmol/L (ref 96–106)
Creatinine, Ser: 0.78 mg/dL (ref 0.57–1.00)
Globulin, Total: 2.6 g/dL (ref 1.5–4.5)
Glucose: 87 mg/dL (ref 70–99)
Potassium: 4 mmol/L (ref 3.5–5.2)
Sodium: 139 mmol/L (ref 134–144)
Total Protein: 6.9 g/dL (ref 6.0–8.5)
eGFR: 102 mL/min/{1.73_m2} (ref >59)

## 2023-05-05 LAB — LIPID PANEL
Chol/HDL Ratio: 2.3 {ratio} (ref 0.0–4.4)
Cholesterol, Total: 167 mg/dL (ref 100–199)
HDL: 74 mg/dL (ref >39)
LDL Chol Calc (NIH): 82 mg/dL (ref 0–99)
Triglycerides: 57 mg/dL (ref 0–149)
VLDL Cholesterol Cal: 11 mg/dL (ref 5–40)

## 2023-05-05 LAB — CBC
Hematocrit: 36.9 % (ref 34.0–46.6)
Hemoglobin: 12.1 g/dL (ref 11.1–15.9)
MCH: 28.7 pg (ref 26.6–33.0)
MCHC: 32.8 g/dL (ref 31.5–35.7)
MCV: 88 fL (ref 79–97)
Platelets: 265 10*3/uL (ref 150–450)
RBC: 4.21 x10E6/uL (ref 3.77–5.28)
RDW: 12.2 % (ref 11.7–15.4)
WBC: 4.3 10*3/uL (ref 3.4–10.8)

## 2023-05-05 LAB — HEMOGLOBIN A1C
Est. average glucose Bld gHb Est-mCnc: 117 mg/dL
Hgb A1c MFr Bld: 5.7 % — ABNORMAL HIGH (ref 4.8–5.6)

## 2023-05-05 LAB — TSH: TSH: 0.609 u[IU]/mL (ref 0.450–4.500)

## 2023-05-08 ENCOUNTER — Ambulatory Visit (HOSPITAL_COMMUNITY)
Admission: RE | Admit: 2023-05-08 | Discharge: 2023-05-08 | Disposition: A | Discharge status: Home / Self Care | Payer: Medicaid Other | Source: Ambulatory Visit | Attending: Obstetrics and Gynecology | Admitting: Obstetrics and Gynecology

## 2023-05-08 DIAGNOSIS — Z3201 Encounter for pregnancy test, result positive: Secondary | ICD-10-CM | POA: Diagnosis present

## 2023-05-08 DIAGNOSIS — Z3491 Encounter for supervision of normal pregnancy, unspecified, first trimester: Secondary | ICD-10-CM | POA: Diagnosis not present

## 2023-05-08 DIAGNOSIS — Z789 Other specified health status: Secondary | ICD-10-CM | POA: Diagnosis present

## 2023-05-08 DIAGNOSIS — Z331 Pregnant state, incidental: Secondary | ICD-10-CM | POA: Diagnosis present

## 2023-05-08 DIAGNOSIS — Z3A01 Less than 8 weeks gestation of pregnancy: Secondary | ICD-10-CM | POA: Insufficient documentation

## 2023-05-09 ENCOUNTER — Encounter: Payer: Self-pay | Admitting: Obstetrics and Gynecology

## 2023-05-09 LAB — CYTOLOGY - PAP
Comment: NEGATIVE
Diagnosis: NEGATIVE
Diagnosis: REACTIVE
High risk HPV: NEGATIVE

## 2023-05-14 ENCOUNTER — Other Ambulatory Visit: Payer: Self-pay | Admitting: Obstetrics and Gynecology

## 2023-05-14 ENCOUNTER — Encounter: Payer: Self-pay | Admitting: Obstetrics and Gynecology

## 2023-05-14 DIAGNOSIS — O3680X0 Pregnancy with inconclusive fetal viability, not applicable or unspecified: Secondary | ICD-10-CM

## 2023-05-16 ENCOUNTER — Ambulatory Visit: Payer: Medicaid Other | Admitting: Obstetrics and Gynecology

## 2023-05-22 ENCOUNTER — Telehealth: Payer: Self-pay

## 2023-05-22 ENCOUNTER — Telehealth (INDEPENDENT_AMBULATORY_CARE_PROVIDER_SITE_OTHER): Payer: Medicaid Other

## 2023-05-22 ENCOUNTER — Other Ambulatory Visit: Payer: Self-pay

## 2023-05-22 ENCOUNTER — Emergency Department (HOSPITAL_COMMUNITY)
Admission: EM | Admit: 2023-05-22 | Discharge: 2023-05-23 | Disposition: A | Discharge status: Home / Self Care | Payer: Medicaid Other | Attending: Emergency Medicine | Admitting: Emergency Medicine

## 2023-05-22 DIAGNOSIS — R739 Hyperglycemia, unspecified: Secondary | ICD-10-CM | POA: Diagnosis not present

## 2023-05-22 DIAGNOSIS — I10 Essential (primary) hypertension: Secondary | ICD-10-CM | POA: Diagnosis not present

## 2023-05-22 DIAGNOSIS — O209 Hemorrhage in early pregnancy, unspecified: Secondary | ICD-10-CM | POA: Diagnosis present

## 2023-05-22 DIAGNOSIS — Z3A01 Less than 8 weeks gestation of pregnancy: Secondary | ICD-10-CM | POA: Insufficient documentation

## 2023-05-22 DIAGNOSIS — J45909 Unspecified asthma, uncomplicated: Secondary | ICD-10-CM | POA: Insufficient documentation

## 2023-05-22 DIAGNOSIS — Z3480 Encounter for supervision of other normal pregnancy, unspecified trimester: Secondary | ICD-10-CM

## 2023-05-22 DIAGNOSIS — E871 Hypo-osmolality and hyponatremia: Secondary | ICD-10-CM | POA: Diagnosis not present

## 2023-05-22 DIAGNOSIS — O99281 Endocrine, nutritional and metabolic diseases complicating pregnancy, first trimester: Secondary | ICD-10-CM | POA: Insufficient documentation

## 2023-05-22 LAB — CBC
HCT: 36.2 % (ref 36.0–46.0)
Hemoglobin: 11.9 g/dL — ABNORMAL LOW (ref 12.0–15.0)
MCH: 29.1 pg (ref 26.0–34.0)
MCHC: 32.9 g/dL (ref 30.0–36.0)
MCV: 88.5 fL (ref 80.0–100.0)
Platelets: 276 10*3/uL (ref 150–400)
RBC: 4.09 MIL/uL (ref 3.87–5.11)
RDW: 12.9 % (ref 11.5–15.5)
WBC: 5.3 10*3/uL (ref 4.0–10.5)
nRBC: 0 % (ref 0.0–0.2)

## 2023-05-22 LAB — HCG, SERUM, QUALITATIVE: Preg, Serum: POSITIVE — AB

## 2023-05-22 LAB — TYPE AND SCREEN
ABO/RH(D): B POS
Antibody Screen: NEGATIVE
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
## 2023-05-22 NOTE — Patient Instructions (Signed)
 First Trimester of Pregnancy  The first trimester of pregnancy starts on the first day of your last menstrual period until the end of week 12. This is also called months 1 through 3 of pregnancy. Body changes during your first trimester Your body goes through many changes during pregnancy. The changes usually return to normal after your baby is born. Physical changes You may gain or lose weight. Your breasts may grow larger and hurt. The area around your nipples may get darker. Dark spots or blotches may develop on your face. You may have changes in your hair. Health changes You may feel like you might vomit (nauseous), and you may vomit. You may have heartburn. You may have headaches. You may have trouble pooping (constipation). Your gums may bleed. Other changes You may get tired easily. You may pee (urinate) more often. Your menstrual periods will stop. You may not feel hungry. You may want to eat certain kinds of food. You may have changes in your emotions from day to day. You may have more dreams. Follow these instructions at home: Medicines Take over-the-counter and prescription medicines only as told by your doctor. Some medicines are not safe during pregnancy. Take a prenatal vitamin that contains at least 600 micrograms (mcg) of folic acid. Eating and drinking Eat healthy meals that include: Fresh fruits and vegetables. Whole grains. Good sources of protein, such as meat, eggs, or tofu. Low-fat dairy products. Avoid raw meat and unpasteurized juice, milk, and cheese. If you feel like you may vomit, or you vomit: Eat 4 or 5 small meals a day instead of 3 large meals. Try eating a few soda crackers. Drink liquids between meals instead of during meals. You may need to take these actions to prevent or treat trouble pooping: Drink enough fluids to keep your pee (urine) pale yellow. Eat foods that are high in fiber. These include beans, whole grains, and fresh fruits and  vegetables. Limit foods that are high in fat and sugar. These include fried or sweet foods. Activity Exercise only as told by your doctor. Most people can do their usual exercise routine during pregnancy. Stop exercising if you have cramps or pain in your lower belly (abdomen) or low back. Do not exercise if it is too hot or too humid, or if you are in a place of great height (high altitude). Avoid heavy lifting. If you choose to, you may have sex unless your doctor tells you not to. Relieving pain and discomfort Wear a good support bra if your breasts are sore. Rest with your legs raised (elevated) if you have leg cramps or low back pain. If you have bulging veins (varicose veins) in your legs: Wear support hose as told by your doctor. Raise your feet for 15 minutes, 3-4 times a day. Limit salt in your food. Safety Wear your seat belt at all times when you are in a car. Talk with your doctor if someone is hurting you or yelling at you. Talk with your doctor if you are feeling sad or have thoughts of hurting yourself. Lifestyle Do not use hot tubs, steam rooms, or saunas. Do not douche. Do not use tampons or scented sanitary pads. Do not use herbal medicines, illegal drugs, or medicines that are not approved by your doctor. Do not drink alcohol. Do not smoke or use any products that contain nicotine or tobacco. If you need help quitting, ask your doctor. Avoid cat litter boxes and soil that is used by cats. These carry  germs that can cause harm to the baby and can cause a loss of your baby by miscarriage or stillbirth. General instructions Keep all follow-up visits. This is important. Ask for help if you need counseling or if you need help with nutrition. Your doctor can give you advice or tell you where to go for help. Visit your dentist. At home, brush your teeth with a soft toothbrush. Floss gently. Write down your questions. Take them to your prenatal visits. Where to find more  information American Pregnancy Association: americanpregnancy.org [employer]: www.acog.org Office on Women's Health: [employer].nz Contact a doctor if: You are dizzy. You have a fever. You have mild cramps or pressure in your lower belly. You have a nagging pain in your belly area. You continue to feel like you may vomit, you vomit, or you have watery poop (diarrhea) for 24 hours or longer. You have a bad-smelling fluid coming from your vagina. You have pain when you pee. You are exposed to a disease that spreads from person to person, such as chickenpox, measles, Zika virus, HIV, or hepatitis. Get help right away if: You have spotting or bleeding from your vagina. You have very bad belly cramping or pain. You have shortness of breath or chest pain. You have any kind of injury, such as from a fall or a car crash. You have new or increased pain, swelling, or redness in an arm or leg. Summary The first trimester of pregnancy starts on the first day of your last menstrual period until the end of week 12 (months 1 through 3). Eat 4 or 5 small meals a day instead of 3 large meals. Do not smoke or use any products that contain nicotine or tobacco. If you need help quitting, ask your doctor. Keep all follow-up visits. This information is not intended to replace advice given to you by your health care provider. Make sure you discuss any questions you have with your health care provider. Document Revised: 10/09/2019 Document Reviewed: 08/15/2019 Elsevier Patient Education  2024 Elsevier Inc. Common Medications Safe in Pregnancy  Acne:      Constipation:  Benzoyl Peroxide     Colace  Clindamycin      Dulcolax Suppository  Topica Erythromycin     Fibercon  Salicylic Acid      Metamucil         Miralax AVOID:        Senakot   Accutane    Cough:  Retin-A       Cough Drops  Tetracycline      Phenergan w/ Codeine if  Rx  Minocycline      Robitussin (Plain & DM)  Antibiotics:     Crabs/Lice:  Ceclor       RID  Cephalosporins    AVOID:  E-Mycins      Kwell  Keflex  Macrobid/Macrodantin   Diarrhea:  Penicillin      Kao-Pectate  Zithromax      Imodium AD         PUSH FLUIDS AVOID:       Cipro     Fever:  Tetracycline      Tylenol (Regular or Extra  Minocycline       Strength)  Levaquin      Extra Strength-Do not          Exceed 8 tabs/24 hrs Caffeine:        200mg /day (equiv. To 1 cup of coffee or  approx. 3 12 oz  sodas)         Gas: Cold/Hayfever:       Gas-X  Benadryl      Mylicon  Claritin       Phazyme  **Claritin-D        Chlor-Trimeton    Headaches:  Dimetapp      ASA-Free Excedrin  Drixoral-Non-Drowsy     Cold Compress  Mucinex (Guaifenasin)     Tylenol (Regular or Extra  Sudafed/Sudafed-12 Hour     Strength)  **Sudafed PE Pseudoephedrine   Tylenol Cold & Sinus     Vicks Vapor Rub  Zyrtec  **AVOID if Problems With Blood Pressure         Heartburn: Avoid lying down for at least 1 hour after meals  Aciphex      Maalox     Rash:  Milk of Magnesia     Benadryl    Mylanta       1% Hydrocortisone Cream  Pepcid  Pepcid Complete   Sleep Aids:  Prevacid      Ambien   Prilosec       Benadryl  Rolaids       Chamomile Tea  Tums (Limit 4/day)     Unisom         Tylenol PM         Warm milk-add vanilla or  Hemorrhoids:       Sugar for taste  Anusol/Anusol H.C.  (RX: Analapram 2.5%)  Sugar Substitutes:  Hydrocortisone OTC     Ok in moderation  Preparation H      Tucks        Vaseline lotion applied to tissue with wiping    Herpes:     Throat:  Acyclovir      Oragel  Famvir  Valtrex     Vaccines:         Flu Shot Leg Cramps:       *Gardasil  Benadryl      Hepatitis A         Hepatitis B Nasal Spray:       Pneumovax  Saline Nasal Spray     Polio Booster         Tetanus Nausea:       Tuberculosis test or PPD  Vitamin B6 25 mg  TID   AVOID:    Dramamine      *Gardasil  Emetrol       Live Poliovirus  Ginger Root 250 mg QID    MMR (measles, mumps &  High Complex Carbs @ Bedtime    rebella)  Sea Bands-Accupressure    Varicella (Chickenpox)  Unisom 1/2 tab TID     *No known complications           If received before Pain:         Known pregnancy;   Darvocet       Resume series after  Lortab        Delivery  Percocet    Yeast:   Tramadol      Femstat  Tylenol 3      Gyne-lotrimin  Ultram       Monistat  Vicodin           MISC:         All Sunscreens           Hair Coloring/highlights          Insect Repellant's          (Including DEET)  Mystic Tans

## 2023-05-22 NOTE — Telephone Encounter (Signed)
 Patient was seen for NOB intake and has not been scheduled for her initial prenatal visit, patient has an ultrasound scheduled on 05/29/23. Please contact patient and schedule. Thank you. KW

## 2023-05-22 NOTE — ED Triage Notes (Signed)
 Patient from home for abd cramping and spotting that started last night. Reports she had a positive pregnancy test on 12/19; has not had any OB appts yet; last menstrual cycle in November, but is not sure. Patient is G7P5. History of preeclampsia that resulted with a stillborn. Upon arrival to ER, patient is alert and oriented, ambu.

## 2023-05-23 ENCOUNTER — Emergency Department (HOSPITAL_COMMUNITY): Payer: Medicaid Other

## 2023-05-23 LAB — HIV ANTIBODY (ROUTINE TESTING W REFLEX): HIV Screen 4th Generation wRfx: NONREACTIVE

## 2023-05-23 LAB — HCG, QUANTITATIVE, PREGNANCY: hCG, Beta Chain, Quant, S: 6346 m[IU]/mL — ABNORMAL HIGH (ref <5)

## 2023-05-23 LAB — BASIC METABOLIC PANEL
Anion gap: 5 (ref 5–15)
BUN: 12 mg/dL (ref 6–20)
CO2: 23 mmol/L (ref 22–32)
Calcium: 8.8 mg/dL — ABNORMAL LOW (ref 8.9–10.3)
Chloride: 104 mmol/L (ref 98–111)
Creatinine, Ser: 0.79 mg/dL (ref 0.44–1.00)
GFR, Estimated: >60 mL/min (ref >60)
Glucose, Bld: 101 mg/dL — ABNORMAL HIGH (ref 70–99)
Potassium: 3.6 mmol/L (ref 3.5–5.1)
Sodium: 132 mmol/L — ABNORMAL LOW (ref 135–145)

## 2023-05-23 LAB — WET PREP, GENITAL
Sperm: NONE SEEN
Trich, Wet Prep: NONE SEEN
WBC, Wet Prep HPF POC: <10 (ref <10)
Yeast Wet Prep HPF POC: NONE SEEN

## 2023-05-23 LAB — RPR: RPR Ser Ql: NONREACTIVE

## 2023-05-23 NOTE — Discharge Instructions (Signed)
 Based on your ultrasound it looks like you are experiencing a miscarriage.  I would like for you to follow-up with your OB/GYN in the next week, you can expect to have some cramping and vaginal bleeding associated with this, return to the ER for severe or heavy bleeding or severe pain

## 2023-05-23 NOTE — ED Notes (Addendum)
 Patient transported to Ultrasound

## 2023-05-23 NOTE — ED Provider Notes (Signed)
 [place] EMERGENCY DEPARTMENT AT [facility] Provider Note   CSN: [id number] Arrival date & time: 05/22/23  2113     History  Chief Complaint  Patient presents with   Abdominal Cramping    Marie Green is a 38 y.o. female.  The history is provided by the patient.  Abdominal Cramping  She has a history of hypertension, asthma and is currently pregnant with uncertain last menses.  She did have a pregnancy test that was positive last month and an ultrasound 11 days ago which showed her at 5 weeks 2 days.  She started having spotting and left-sided pelvic cramping yesterday afternoon.  She has not had any frank bleeding.  She denies nausea or vomiting.  She is gravida 7 para 6 (1 pregnancy loss at 27 weeks secondary to preeclampsia).   Home Medications Prior to Admission medications   Medication Sig Start Date End Date Taking? Authorizing Provider  albuterol  (VENTOLIN  HFA) 108 (90 Base) MCG/ACT inhaler Inhale 2 puffs into the lungs every 6 (six) hours as needed for wheezing or shortness of breath. 05/13/22   Rising, Asberry, PA-C      Allergies    Mushroom extract complex (do not select), Amoxicillin, and Flagyl  [metronidazole ]    Review of Systems   Review of Systems  All other systems reviewed and are negative.   Physical Exam Updated Vital Signs BP 130/83   Pulse 91   Temp 98.9 F (37.2 C) (Oral)   Resp 16   Ht 5' 5.5 (1.664 m)   Wt 74.8 kg   LMP 02/25/2023 (Approximate)   SpO2 100%   BMI 27.04 kg/m  Physical Exam Vitals and nursing note reviewed.   38 year old female, resting comfortably and in no acute distress. Vital signs are normal. Oxygen saturation is 100%, which is normal. Head is normocephalic and atraumatic. PERRLA, EOMI. Oropharynx is clear. Lungs are clear without rales, wheezes, or rhonchi. Chest is nontender. Heart has regular rate and rhythm without murmur. Abdomen is soft, flat, with mild left suprapubic tenderness. Pelvic: Small  amount of brownish material present in the vaginal vault.  Cervix is closed.  Fundus is about 6 weeks size and nontender.  There is no right adnexal tenderness but there is fullness in the left adnexa with moderate tenderness. Neurologic: Mental status is normal, moves all extremities equally.  ED Results / Procedures / Treatments   Labs (all labs ordered are listed, but only abnormal results are displayed) Labs Reviewed  WET PREP, GENITAL - Abnormal; Notable for the following components:      Result Value   Clue Cells Wet Prep HPF POC PRESENT (*)    All other components within normal limits  CBC - Abnormal; Notable for the following components:   Hemoglobin 11.9 (*)    All other components within normal limits  HCG, SERUM, QUALITATIVE - Abnormal; Notable for the following components:   Preg, Serum POSITIVE (*)    All other components within normal limits  HCG, QUANTITATIVE, PREGNANCY - Abnormal; Notable for the following components:   hCG, Beta Chain, Quant, S 6,346 (*)    All other components within normal limits  BASIC METABOLIC PANEL - Abnormal; Notable for the following components:   Sodium 132 (*)    Glucose, Bld 101 (*)    Calcium 8.8 (*)    All other components within normal limits  RPR  HIV ANTIBODY (ROUTINE TESTING W REFLEX)  TYPE AND SCREEN  GC/CHLAMYDIA PROBE AMP ([place]) NOT  AT [facility]   Radiology No results found.  Procedures Procedures    Medications Ordered in ED Medications - No data to display  ED Course/ Medical Decision Making/ A&P                                 Medical Decision Making Amount and/or Complexity of Data Reviewed Labs: ordered. Radiology: ordered.   First trimester bleeding and cramping.  Possible miscarriage, possible ectopic pregnancy.  I reviewed her past records, and on 05/12/2023 she did have a pelvic ultrasound showing IUP 5 weeks 2 days with no yolk sac or fetal pole visualized.  I would put her at 6 weeks 6 days today.   She will need repeat pelvic ultrasound to rule out heterotopic pregnancy.  She did not have an hCG level done previously.  I have ordered an hCG level.  I have reviewed her other labs, my interpretation is mild anemia with hemoglobin not significantly changed from 05/04/2023, blood type B+ - not a RhoGAM candidate.  hCG level has come back 6346, which is lower than I would expect for this stage of pregnancy.  I suspect that she is having a threatened miscarriage.  Wet prep is positive for clue cells, but she does not clinically have bacterial vaginosis.  Pelvic ultrasound is pending.  Case is signed out to Dr. Cleotilde.  Final Clinical Impression(s) / ED Diagnoses Final diagnoses:  First trimester bleeding  Hyponatremia  Elevated random blood glucose level    Rx / DC Orders ED Discharge Orders     None         Raford Lenis, MD 05/23/23 [phone]

## 2023-05-23 NOTE — ED Provider Notes (Signed)
 At change of shift care signed out from Dr. Raford to follow-up ultrasound and disposition appropriately.  This patient has normal vital signs, evidence of pregnancy failure on ultrasound, patient informed and expressed understanding, no surgical emergency is present at this time, no heterotopic   Marie Rogue, MD 05/23/23 [phone]

## 2023-05-24 LAB — GC/CHLAMYDIA PROBE AMP (~~LOC~~) NOT AT ARMC
Chlamydia: NEGATIVE
Comment: NEGATIVE
Comment: NORMAL
Neisseria Gonorrhea: NEGATIVE

## 2023-05-25 NOTE — Telephone Encounter (Signed)
 Please have patient's appointment switched to a day when I'm in the office. Also, can cancel her next ultrasound that was scheduled.

## 2023-05-26 ENCOUNTER — Other Ambulatory Visit: Payer: Medicaid Other

## 2023-05-26 ENCOUNTER — Other Ambulatory Visit: Payer: Self-pay

## 2023-05-26 DIAGNOSIS — O039 Complete or unspecified spontaneous abortion without complication: Secondary | ICD-10-CM

## 2023-05-27 LAB — BETA HCG QUANT (REF LAB): hCG Quant: 4828 m[IU]/mL

## 2023-05-29 ENCOUNTER — Encounter: Payer: Medicaid Other | Admitting: Certified Nurse Midwife

## 2023-05-29 ENCOUNTER — Ambulatory Visit (HOSPITAL_COMMUNITY): Admission: RE | Admit: 2023-05-29 | Payer: Medicaid Other | Source: Ambulatory Visit

## 2023-05-29 ENCOUNTER — Other Ambulatory Visit: Payer: Medicaid Other

## 2023-05-30 ENCOUNTER — Ambulatory Visit: Payer: Medicaid Other | Admitting: Obstetrics and Gynecology

## 2023-05-30 ENCOUNTER — Encounter: Payer: Self-pay | Admitting: Obstetrics and Gynecology

## 2023-05-30 ENCOUNTER — Encounter: Payer: Medicaid Other | Admitting: Obstetrics and Gynecology

## 2023-05-30 VITALS — BP 122/93 | HR 84 | Resp 16 | Ht 65.5 in | Wt 180.9 lb

## 2023-05-30 DIAGNOSIS — Z3A01 Less than 8 weeks gestation of pregnancy: Secondary | ICD-10-CM | POA: Diagnosis not present

## 2023-05-30 DIAGNOSIS — O021 Missed abortion: Secondary | ICD-10-CM

## 2023-05-30 MED ORDER — IBUPROFEN 800 MG PO TABS: 800.0000 mg | ORAL_TABLET | Freq: Three times a day (TID) | ORAL | 0 refills | Status: AC | PRN

## 2023-05-30 MED ORDER — MISOPROSTOL 200 MCG PO TABS: 600.0000 ug | ORAL_TABLET | Freq: Once | ORAL | 1 refills | Status: DC

## 2023-05-30 MED ORDER — OXYCODONE-ACETAMINOPHEN 5-325 MG PO TABS: 1.0000 | ORAL_TABLET | Freq: Four times a day (QID) | ORAL | 0 refills | Status: DC | PRN

## 2023-05-30 NOTE — Patient Instructions (Signed)
  CYTOTEC (MISOPROSTOL) INSTRUCTIONS   Who should not take this medication?  . If you have an allergy to Cytotec   What are the side effects of the Cytotec?  . Menstrual cramping or contractions . Vaginal bleeding-soaking a maxi pad within 1 hour is considered excessive bleeding and you should go to the emergency room for further evaluation . Headache . Abdominal pain . Nausea or vomiting.  Small frequent meals, sucking on hard sugar-free candy, or chewing sugar-free gum may help.  . Diarrhea   How do I take Cytotec?  Once the pregnancy has remained to be an.,  He has 3 options for treatment.  You have chosen medical management.  You will be given a prescription for Cytotec 800 mcg in four 200 mcg.  We will place 4 tablets within your vagina (as high in your vagina as she can place them) and you may place a tampon (or just lie down) to ensure the pills do not fall out.  Your pregnancy will begin to pass in about 8 to 12 hours after placement of the tablets.  Cramping, abdominal pain, and bleeding is common.  This is how the medication works.  You may take ibuprofen 800 mg every 8 hours for mild discomfort.  If a prescription of Vicodin or Percocet was given, you may also take this medication every 4-6 hours for moderate to severe pain.   If you do not have significant bleeding within 4 hours after placement of Cytotec, you should repeat this process the next day.  If you pass the pregnancy, please make sure you have a follow-up appointment with our office in 1 week.  To ensure complete passage of the pregnancy with either an ultrasound or blood tests at this visit.  Expect bleeding for 1 to 3 weeks after the miscarriage passes, however this bleeding should be light.  If your blood type is Rh negative, please ensure you receive your RhoGam injection within 72 hours of bleeding  If your bleeding stops maxi pad completely within 1 hour, your bleeding to heavily, and should go to the emergency  room for further evaluation.  Cytotec is 86% effective within the first 24 hours and 94% effective within 48 hours with a repeat dose.  If you have no significant bleeding within 48 hours, please call our office.

## 2023-05-30 NOTE — Progress Notes (Signed)
 GYNECOLOGY PROGRESS NOTE  Subjective:    Patient ID: Marie Green, female    DOB: 1986-03-01, 38 y.o.   MRN: [id number]  HPI  Patient is a 38 y.o. G37P5105 female who presents for evaluation of likely missed AB. She discovered she was pregnant on 05/04/2023 during a routine wellness exam. Was unsure of her dates, so had an ultrasound performed on 05/08/23, showing EGA 5.2 weeks, however no fetal pole or yolk sac identified. She began having some bleeding and cramping on 05/20/2022, and has been ongoing intermittently since that time. She was evaluated at ED on 05/22/2022 and was told that she was most likely experiencing a miscarriage. Had a repeat sono at that time, which noted EGA of 5.5 weeks (should have been closer to 7 weeks at that time). She reports that she continues to have some light bleeding and cramping but not as bad as it has been.  The following portions of the patient's history were reviewed and updated as appropriate: allergies, current medications, past family history, past medical history, past social history, past surgical history, and problem list.  Review of Systems Pertinent items are noted in HPI.   Objective:   Blood pressure (!) 122/93, pulse 84, resp. rate 16, height 5' 5.5 (1.664 m), weight 180 lb 14.4 oz (82.1 kg), last menstrual period 02/25/2023. Body mass index is 29.65 kg/m. General appearance: alert and no distress Abdomen: soft, non-tender; bowel sounds normal; no masses,  no organomegaly Pelvic: deferred   Labs:  Appointment on 05/26/2023  Component Date Value Ref Range Status   hCG Quant 05/26/2023 4,828  mIU/mL Final   Comment:                      Female (Non-pregnant)    0 -     5                             (Postmenopausal)  0 -     8                      Female (Pregnant)                      Weeks of Gestation                              3                6 -    71                              4               10 -   750                               5              217 -  7138                              6              158 - 31795  7             [id number] -[id number]                              8            32065 -149571                              9            63803 -151410                             10            46509 -[id number]                             12            27832 -[id number]                             14            13950 - 62530                             15            12039 - 70971                             16             9040 - 56451                             17             8175 - [phone] Roche E                          CLIA methodology    Lab Results  Component Value Date   WBC 5.3 05/22/2023   HGB 11.9 (L) 05/22/2023   HCT 36.2 05/22/2023   MCV 88.5 05/22/2023   PLT 276 05/22/2023    Lab Results  Component Value Date   ABORH B POS 05/22/2023      Imaging:  US  OB LESS THAN 14 WEEKS WITH OB TRANSVAGINAL CLINICAL DATA:  Two day history of spotting and cramping. Beta hCG = 6,346  EXAM: OBSTETRIC <14 WK US  AND TRANSVAGINAL OB US   TECHNIQUE: Both transabdominal and transvaginal ultrasound examinations were performed for complete evaluation of the gestation as well as the maternal uterus, adnexal regions, and pelvic cul-de-sac. Transvaginal technique was performed to assess early pregnancy.  COMPARISON:  Obstetric ultrasound dated 05/08/2023  FINDINGS: Intrauterine gestational sac: Single  Yolk sac:  Visualized.  Embryo:  Not Visualized.  MSD: 9.6 mm   5 w   5 d  Subchorionic hemorrhage:  None visualized.  Maternal  uterus/adnexae: Uterus measures 11.4 cm in sagittal dimension. Endometrium measures 17 mm. Normal ovaries.  IMPRESSION: Findings diagnostic of pregnancy failure when compared to initial obstetric ultrasound dated 05/08/2023 demonstrating gestational sac without yolk sac.  Electronically  Signed   By: Limin  Xu M.D.   On: 05/23/2023 08:46    Assessment:   1. Missed abortion      Plan:   - Patient diagnosed with missed AB.  Discussed diagnosis, also reviewed management options which include: expectant management vs misoprostol  vs D&E.  Risks and benefits of all modalities discussed; all questions answered.  Patient opted for medical management with misoprostol .   Risks and benefits of medical management were carefully explained, including a success rate of 80-90%, the need for another person to be at home with her, and to call/come in if she had heavy bleeding, dizziness, or severe pain not relieved by medication.  She will follow up in 1-2 weeks after misoprostol  administration; if there has been no passage of pregnancy, will consider repeat misoprostol  vs D&E.  Patient should again be advised to call or come in for evaluation for any concerning symptoms; bleeding precautions strictly emphasized.  Small prescription for pain medication sent to patient's pharmacy.  - Rh positive, does not require Rhogam. Repeat HCG ordered.  - Discussed future fertility plans. Does not desire conception again any time soon. Would like a Nexplanon  for contraception. Can insert at next visit.     Archie Savers, MD [facility]

## 2023-05-31 LAB — BETA HCG QUANT (REF LAB): hCG Quant: 1015 m[IU]/mL

## 2023-06-01 ENCOUNTER — Telehealth: Payer: Self-pay

## 2023-06-01 NOTE — Telephone Encounter (Signed)
Patient is calling with concerns of just spotting after taking Cytotec on Tuesday. She had some severe cramping and pain this morning, but right now the pain is tolerable. She wants to know is the spotting normal. Should she be expected to pass more. Please advise.

## 2023-06-02 NOTE — Telephone Encounter (Signed)
Patient called.  Patient aware.  

## 2023-06-02 NOTE — Telephone Encounter (Signed)
Typically we do expect more than just spotting.  When she follows up at her next visit we'll repeat an ultrasound as she may have already passed everything before she took the cytotec.  If the ultrasound is still showing the pregnancy, she can take another round of the cytotec.

## 2023-06-07 NOTE — Progress Notes (Signed)
 This encounter was created in error - please disregard. This encounter was created in error - please disregard. This encounter was created in error - please disregard.

## 2023-06-13 ENCOUNTER — Ambulatory Visit: Payer: Medicaid Other

## 2023-06-13 ENCOUNTER — Other Ambulatory Visit: Payer: Self-pay | Admitting: Obstetrics and Gynecology

## 2023-06-13 ENCOUNTER — Encounter: Payer: Self-pay | Admitting: Obstetrics and Gynecology

## 2023-06-13 ENCOUNTER — Ambulatory Visit: Payer: Medicaid Other | Admitting: Obstetrics and Gynecology

## 2023-06-13 VITALS — BP 111/84 | HR 76 | Ht 65.5 in | Wt 176.4 lb

## 2023-06-13 DIAGNOSIS — Z30017 Encounter for initial prescription of implantable subdermal contraceptive: Secondary | ICD-10-CM | POA: Diagnosis not present

## 2023-06-13 DIAGNOSIS — Z8759 Personal history of other complications of pregnancy, childbirth and the puerperium: Secondary | ICD-10-CM

## 2023-06-13 DIAGNOSIS — O021 Missed abortion: Secondary | ICD-10-CM

## 2023-06-13 DIAGNOSIS — Z01818 Encounter for other preprocedural examination: Secondary | ICD-10-CM

## 2023-06-13 DIAGNOSIS — Z09 Encounter for follow-up examination after completed treatment for conditions other than malignant neoplasm: Secondary | ICD-10-CM

## 2023-06-13 DIAGNOSIS — Z3A01 Less than 8 weeks gestation of pregnancy: Secondary | ICD-10-CM

## 2023-06-13 DIAGNOSIS — O034 Incomplete spontaneous abortion without complication: Secondary | ICD-10-CM

## 2023-06-13 DIAGNOSIS — O3680X0 Pregnancy with inconclusive fetal viability, not applicable or unspecified: Secondary | ICD-10-CM

## 2023-06-13 DIAGNOSIS — R454 Irritability and anger: Secondary | ICD-10-CM

## 2023-06-13 MED ORDER — ETONOGESTREL 68 MG ~~LOC~~ IMPL
68.0000 mg | DRUG_IMPLANT | Freq: Once | SUBCUTANEOUS | Status: AC
  Administered 2023-06-13: 68 mg via SUBCUTANEOUS

## 2023-06-13 MED ORDER — ESCITALOPRAM OXALATE 10 MG PO TABS: ORAL_TABLET | ORAL | 3 refills | Status: AC

## 2023-06-13 NOTE — Patient Instructions (Addendum)
Nexplanon Instructions After Insertion  Keep bandage clean and dry for 24 hours  May use ice/Tylenol/Ibuprofen for soreness or pain  If you develop fever, drainage or increased warmth from incision site-contact office immediately  Etonogestrel Implant What is this medication? ETONOGESTREL (et oh noe JES trel) prevents ovulation and pregnancy. It belongs to a group of medications called contraceptives. This medication is a progestin hormone. This medicine may be used for other purposes; ask your health care provider or pharmacist if you have questions. COMMON BRAND NAME(S): Implanon, Nexplanon What should I tell my care team before I take this medication? They need to know if you have any of these conditions: Abnormal vaginal bleeding Blood clots Blood vessel disease Breast, cervical, endometrial, ovarian, liver, or uterine cancer Diabetes Gallbladder disease Heart disease or recent heart attack High blood pressure High cholesterol or triglycerides Kidney disease Liver disease Migraine headaches Seizures Stroke Tobacco use An unusual or allergic reaction to etonogestrel, other medications, foods, dyes, or preservatives Pregnant or trying to get pregnant Breastfeeding How should I use this medication? This device is inserted just under the skin on the inner side of your upper arm by your care team. Talk to your care team about the use of this medication in children. Special care may be needed. Overdosage: If you think you have taken too much of this medicine contact a poison control center or emergency room at once. NOTE: This medicine is only for you. Do not share this medicine with others. What if I miss a dose? This does not apply. What may interact with this medication? Do not take this medication with any of the following: Amprenavir Fosamprenavir This medication may also interact with the  following: Acitretin Aprepitant Armodafinil Bexarotene Bosentan Carbamazepine Certain antivirals for HIV or hepatitis Certain medications for fungal infections, such as fluconazole, ketoconazole, itraconazole, or voriconazole Cyclosporine Felbamate Griseofulvin Lamotrigine Modafinil Oxcarbazepine Phenobarbital Phenytoin Primidone Rifabutin Rifampin Rifapentine St. John's wort Topiramate This list may not describe all possible interactions. Give your health care provider a list of all the medicines, herbs, non-prescription drugs, or dietary supplements you use. Also tell them if you smoke, drink alcohol, or use illegal drugs. Some items may interact with your medicine. What should I watch for while using this medication? Visit your care team for regular checks on your progress. Using this medication does not protect you or your partner against HIV or other sexually transmitted infections (STIs). You should be able to feel the implant by pressing your fingertips over the skin where it was inserted. Contact your care team if you cannot feel the implant, and use a non-hormonal birth control method (such as condoms) until your care team confirms that the implant is in place. Contact your care team if you think that the implant may have broken or become bent while in your arm. You will receive a user card from your care team after the implant is inserted. The card is a record of the location of the implant in your upper arm and when it should be removed. Keep this card with your health records. What side effects may I notice from receiving this medication? Side effects that you should report to your care team as soon as possible: Allergic reactions--skin rash, itching, hives, swelling of the face, lips, tongue, or throat Blood clot--pain, swelling, or warmth in the leg, shortness of breath, chest pain Gallbladder problems--severe stomach pain, nausea, vomiting, fever Increase in blood  pressure Liver injury--right upper belly pain, loss of appetite, nausea,  light-colored stool, dark yellow or brown urine, yellowing skin or eyes, unusual weakness or fatigue New or worsening migraines or headaches Pain, redness, or irritation at injection site Stroke--sudden numbness or weakness of the face, arm, or leg, trouble speaking, confusion, trouble walking, loss of balance or coordination, dizziness, severe headache, change in vision Unusual vaginal discharge, itching, or odor Worsening mood, feelings of depression Side effects that usually do not require medical attention (report to your care team if they continue or are bothersome): Breast pain or tenderness Dark patches of skin on the face or other sun-exposed areas Irregular menstrual cycles or spotting Nausea Weight gain This list may not describe all possible side effects. Call your doctor for medical advice about side effects. You may report side effects to FDA at 1-800-FDA-1088. Where should I keep my medication? This medication is given in a hospital or clinic and will not be stored at home. NOTE: This sheet is a summary. It may not cover all possible information. If you have questions about this medicine, talk to your doctor, pharmacist, or health care provider.  2024 Elsevier/Gold Standard (2021-12-07 00:00:00)

## 2023-06-13 NOTE — Progress Notes (Signed)
GYNECOLOGY CLINIC PROGRESS NOTE  Subjective:    Patient ID: Marie Green, female    DOB: November 01, 1985, 38 y.o.   MRN: [id number]  HPI  Patient is a 38 y.o. [id number] female who presents for follow up after recent miscarriage managed with Cytotec.  Was diagnosed on 05/30/2023, noted gestational sac at [redacted] weeks gestation. Also presents for contraception management, desires Nexplanon.  Reports that she is still experiencing some daily bleeding. Bleeding is not heavy, denies passing clots but waxes and wanes from light to moderate flow.   Emotionally, patient notes that she has not been coping well. States that it is not just the miscarriage, but also dealing with a lot of stressors at work that have worsened her mood. Feels like crying often.   The following portions of the patient's history were reviewed and updated as appropriate: allergies, current medications, past family history, past medical history, past social history, past surgical history, and problem list.  Review of Systems Pertinent items noted in HPI and remainder of comprehensive ROS otherwise negative.   Objective:   Blood pressure 111/84, pulse 76, height 5' 5.5" (1.664 m), weight 176 lb 6.4 oz (80 kg), last menstrual period 02/25/2023. Body mass index is 28.91 kg/m. General appearance: alert and mild distress (emotional) Abdomen: soft, non-tender; bowel sounds normal; no masses,  no organomegaly Pelvic: deferred     06/13/2023    4:26 PM 09/25/2019   10:48 AM 08/13/2019   11:16 AM 07/01/2019    2:00 PM 06/30/2019   11:18 AM  Edinburgh Postnatal Depression Scale Screening Tool  I have been able to laugh and see the funny side of things. 1 0 0 1 --  I have looked forward with enjoyment to things. 2 0 0 2   I have blamed myself unnecessarily when things went wrong. 2 1 0 0   I have been anxious or worried for no good reason. 2 3 0 2   I have felt scared or panicky for no good reason. 1 3 0 1   Things have been getting on top  of me. 2 2 0 1   I have been so unhappy that I have had difficulty sleeping. 1 3 0 2   I have felt sad or miserable. 2 1 0 1   I have been so unhappy that I have been crying. 2 0 0 1   The thought of harming myself has occurred to me. 0 0 0 0   Edinburgh Postnatal Depression Scale Total 15 13 0 11       Labs:   Latest Reference Range & Units 05/23/23 05:13 05/26/23 10:13 05/30/23 15:30  hCG Quant mIU/mL  4,828 1,015  HCG, Beta Chain, Quant, S <5 mIU/mL 6,346 (H)    (H): Data is abnormally high  Lab Results  Component Value Date   WBC 5.3 05/22/2023   HGB 11.9 (L) 05/22/2023   HCT 36.2 05/22/2023   MCV 88.5 05/22/2023   PLT 276 05/22/2023    Lab Results  Component Value Date   ABORH B POS 05/22/2023     Imaging:  Patient Name: Marie Green DOB: [birth year] MRN: [id number] LMP:    ULTRASOUND REPORT   Location: [facility] Date of Service: 06/13/2023    Indications: retained products of conception? Findings:  The uterus is anteverted and measures 9.6 x 3.1 x 6.8 cm  Echo texture is homogenous without evidence of focal masses.   The Endometrium measures 12.8 mm with  color doppler visualized within the endometrial canal.   Impression: 1. Suspected retained products of conception   Recommendations: 1.Clinical correlation with the patient's History and Physical Exam.     Katheran Awe, RDMS  Assessment:   1. History of miscarriage   2. Nexplanon insertion   3. Retained products of conception after miscarriage   4. Irritability      Plan:  1. History of miscarriage (Primary) - Originally planned to repeat BHCG, but in light of patient's persistent bleeding, ordered in office ultrasound which noted retained products. Reviewed results with patient. See discussion of management below.   2. Nexplanon insertion - Desires Nexplanon for contraception. Insertion performed today, see procedure note.  - etonogestrel (NEXPLANON) implant 68 mg  3.  Retained products of conception after miscarriage - Discussed options of medical management with another dose of Cytotec vs surgical intervention with MVA (in office) or D&C (in hospital).  Patient would prefer to have D&C performed. Discussed risks and benefits of procedure. Desires to have procedure as soon as possible.  Will schedule for 06/15/2023. Preop done today.   4.  Irritability - Patient may be experiencing some depressive symptoms surrounding miscarriage, but also noting many stressors at work that are causing her to experience significant irritability. Edinburgh depression screen positive today, score = 15. Discussed options of counseling, vs initiation of medication.  Patient prefers medication. Will prescribe Lexapro. Will f/u in 4 weeks for reassessment of her symptoms.     Nexplanon Insertion Procedure Patient identified, informed consent performed, consent signed.   Patient does understand that irregular bleeding is a very common side effect of this medication. She was advised to have backup contraception for one week after placement. Pregnancy test in clinic today was negative.  Appropriate time out taken.  Patient's left arm was prepped and draped in the usual sterile fashion. The ruler used to measure and mark insertion area.  Patient was prepped with alcohol swab and then injected with 3 ml of 1% lidocaine.  She was prepped with betadine, Nexplanon removed from packaging,  Device confirmed in needle, then inserted full length of needle and withdrawn per handbook instructions. Nexplanon was able to palpated in the patient's arm; patient palpated the insert herself. There was minimal blood loss.  Patient insertion site covered with guaze and a pressure bandage to reduce any bruising.  The patient tolerated the procedure well and was given post procedure instructions.    Hildred Laser, MD [facility]

## 2023-06-14 MED ORDER — LACTATED RINGERS IV SOLN
INTRAVENOUS | Status: DC
Start: 2023-06-14 — End: 2023-06-15

## 2023-06-14 MED ORDER — ORAL CARE MOUTH RINSE: 15.0000 mL | Freq: Once | OROMUCOSAL | Status: AC

## 2023-06-14 MED ORDER — ACETAMINOPHEN 500 MG PO TABS
1000.0000 mg | ORAL_TABLET | ORAL | Status: AC
  Administered 2023-06-15: 1000 mg via ORAL

## 2023-06-14 MED ORDER — CHLORHEXIDINE GLUCONATE 0.12 % MT SOLN
15.0000 mL | Freq: Once | OROMUCOSAL | Status: AC
Start: 2023-06-14 — End: 2023-06-15
  Administered 2023-06-15: 15 mL via OROMUCOSAL

## 2023-06-15 ENCOUNTER — Encounter
Admission: RE | Disposition: A | Discharge status: Home / Self Care | Payer: Self-pay | Source: Home / Self Care | Attending: Obstetrics and Gynecology

## 2023-06-15 ENCOUNTER — Encounter: Payer: Self-pay | Admitting: Obstetrics and Gynecology

## 2023-06-15 ENCOUNTER — Other Ambulatory Visit: Payer: Self-pay

## 2023-06-15 ENCOUNTER — Ambulatory Visit
Admission: RE | Admit: 2023-06-15 | Discharge: 2023-06-15 | Disposition: A | Discharge status: Home / Self Care | Payer: Medicaid Other | Attending: Obstetrics and Gynecology | Admitting: Obstetrics and Gynecology

## 2023-06-15 ENCOUNTER — Ambulatory Visit: Payer: Medicaid Other | Admitting: Certified Registered Nurse Anesthetist

## 2023-06-15 DIAGNOSIS — Z5986 Financial insecurity: Secondary | ICD-10-CM | POA: Diagnosis not present

## 2023-06-15 DIAGNOSIS — Z01818 Encounter for other preprocedural examination: Secondary | ICD-10-CM

## 2023-06-15 DIAGNOSIS — Z9889 Other specified postprocedural states: Secondary | ICD-10-CM

## 2023-06-15 DIAGNOSIS — O034 Incomplete spontaneous abortion without complication: Secondary | ICD-10-CM | POA: Diagnosis present

## 2023-06-15 DIAGNOSIS — O10913 Unspecified pre-existing hypertension complicating pregnancy, third trimester: Secondary | ICD-10-CM | POA: Diagnosis not present

## 2023-06-15 HISTORY — PX: DILATION AND CURETTAGE OF UTERUS: SHX78

## 2023-06-15 LAB — CBC
HCT: 36.1 % (ref 36.0–46.0)
Hemoglobin: 12 g/dL (ref 12.0–15.0)
MCH: 28.5 pg (ref 26.0–34.0)
MCHC: 33.2 g/dL (ref 30.0–36.0)
MCV: 85.7 fL (ref 80.0–100.0)
Platelets: 240 10*3/uL (ref 150–400)
RBC: 4.21 MIL/uL (ref 3.87–5.11)
RDW: 12.3 % (ref 11.5–15.5)
WBC: 3.4 10*3/uL — ABNORMAL LOW (ref 4.0–10.5)
nRBC: 0 % (ref 0.0–0.2)

## 2023-06-15 LAB — HCG, QUANTITATIVE, PREGNANCY: hCG, Beta Chain, Quant, S: 8 m[IU]/mL — ABNORMAL HIGH (ref <5)

## 2023-06-15 SURGERY — DILATION AND CURETTAGE
Anesthesia: General

## 2023-06-15 MED ORDER — 0.9 % SODIUM CHLORIDE (POUR BTL) OPTIME
TOPICAL | Status: DC | PRN
  Administered 2023-06-15: 20 mL

## 2023-06-15 MED ORDER — OXYCODONE HCL 5 MG PO TABS
5.0000 mg | ORAL_TABLET | Freq: Once | ORAL | Status: AC | PRN
  Administered 2023-06-15: 5 mg via ORAL

## 2023-06-15 MED ORDER — FENTANYL CITRATE (PF) 100 MCG/2ML IJ SOLN
INTRAMUSCULAR | Status: DC | PRN
  Administered 2023-06-15: 50 ug via INTRAVENOUS

## 2023-06-15 MED ORDER — DEXMEDETOMIDINE HCL IN NACL 80 MCG/20ML IV SOLN
INTRAVENOUS | Status: DC | PRN
  Administered 2023-06-15: 12 ug via INTRAVENOUS
  Administered 2023-06-15 (×2): 8 ug via INTRAVENOUS

## 2023-06-15 MED ORDER — FENTANYL CITRATE (PF) 100 MCG/2ML IJ SOLN
INTRAMUSCULAR | Status: AC
  Filled 2023-06-15: qty 2

## 2023-06-15 MED ORDER — CHLORHEXIDINE GLUCONATE 0.12 % MT SOLN
OROMUCOSAL | Status: AC
  Filled 2023-06-15: qty 15

## 2023-06-15 MED ORDER — OXYCODONE HCL 5 MG/5ML PO SOLN: 5.0000 mg | Freq: Once | ORAL | Status: AC | PRN

## 2023-06-15 MED ORDER — MIDAZOLAM HCL 2 MG/2ML IJ SOLN
INTRAMUSCULAR | Status: AC
  Filled 2023-06-15: qty 2

## 2023-06-15 MED ORDER — FENTANYL CITRATE (PF) 100 MCG/2ML IJ SOLN: 25.0000 ug | INTRAMUSCULAR | Status: DC | PRN

## 2023-06-15 MED ORDER — SODIUM CHLORIDE 0.9 % IV BOLUS
500.0000 mL | Freq: Once | INTRAVENOUS | Status: AC
  Administered 2023-06-15: 500 mL via INTRAVENOUS

## 2023-06-15 MED ORDER — ACETAMINOPHEN 500 MG PO TABS
ORAL_TABLET | ORAL | Status: AC
  Filled 2023-06-15: qty 2

## 2023-06-15 MED ORDER — PROPOFOL 1000 MG/100ML IV EMUL
INTRAVENOUS | Status: AC
  Filled 2023-06-15: qty 100

## 2023-06-15 MED ORDER — OXYCODONE HCL 5 MG PO TABS: 5.0000 mg | ORAL_TABLET | Freq: Once | ORAL | Status: DC

## 2023-06-15 MED ORDER — DOXYCYCLINE HYCLATE 100 MG PO CAPS: 100.0000 mg | ORAL_CAPSULE | Freq: Two times a day (BID) | ORAL | 0 refills | Status: AC

## 2023-06-15 MED ORDER — LIDOCAINE HCL (CARDIAC) PF 100 MG/5ML IV SOSY
PREFILLED_SYRINGE | INTRAVENOUS | Status: DC | PRN
  Administered 2023-06-15: 40 mg via INTRAVENOUS

## 2023-06-15 MED ORDER — OXYCODONE HCL 5 MG PO TABS
ORAL_TABLET | ORAL | Status: AC
  Filled 2023-06-15: qty 1

## 2023-06-15 MED ORDER — PROPOFOL 500 MG/50ML IV EMUL
INTRAVENOUS | Status: DC | PRN
  Administered 2023-06-15: 40 mg via INTRAVENOUS
  Administered 2023-06-15: 70 mg via INTRAVENOUS
  Administered 2023-06-15 (×3): 40 mg via INTRAVENOUS

## 2023-06-15 MED ORDER — MIDAZOLAM HCL 2 MG/2ML IJ SOLN
INTRAMUSCULAR | Status: DC | PRN
  Administered 2023-06-15: 2 mg via INTRAVENOUS

## 2023-06-15 SURGICAL SUPPLY — 22 items
CUP MEDICINE 2OZ PLAST GRAD ST (MISCELLANEOUS) ×1 IMPLANT
DRSG TELFA 3X8 NADH STRL (GAUZE/BANDAGES/DRESSINGS) ×1 IMPLANT
GLOVE BIO SURGEON STRL SZ 6.5 (GLOVE) ×1 IMPLANT
GLOVE INDICATOR 7.0 STRL GRN (GLOVE) ×1 IMPLANT
GOWN STRL REUS W/ TWL LRG LVL3 (GOWN DISPOSABLE) ×2 IMPLANT
KIT TURNOVER CYSTO (KITS) ×1 IMPLANT
MANIFOLD NEPTUNE II (INSTRUMENTS) ×1 IMPLANT
NDL HYPO 25X1 1.5 SAFETY (NEEDLE) ×1 IMPLANT
NDL SPNL 25GX3.5 QUINCKE BL (NEEDLE) ×1 IMPLANT
NEEDLE HYPO 25X1 1.5 SAFETY (NEEDLE) ×1 IMPLANT
NEEDLE SPNL 25GX3.5 QUINCKE BL (NEEDLE) ×1 IMPLANT
NS IRRIG 500ML POUR BTL (IV SOLUTION) ×1 IMPLANT
PACK DNC HYST (MISCELLANEOUS) ×1 IMPLANT
PAD OB MATERNITY 11 LF (PERSONAL CARE ITEMS) ×1 IMPLANT
PAD PREP OB/GYN DISP 24X41 (PERSONAL CARE ITEMS) ×1 IMPLANT
SCRUB CHG 4% DYNA-HEX 4OZ (MISCELLANEOUS) ×1 IMPLANT
SET CYSTO W/LG BORE CLAMP LF (SET/KITS/TRAYS/PACK) IMPLANT
SYR 10ML LL (SYRINGE) ×1 IMPLANT
SYR CONTROL 10ML LL (SYRINGE) ×1 IMPLANT
TRAP FLUID SMOKE EVACUATOR (MISCELLANEOUS) ×1 IMPLANT
VACURETTE 6 ASPIR F TIP BERK (CANNULA) IMPLANT
WATER STERILE IRR 500ML POUR (IV SOLUTION) ×1 IMPLANT

## 2023-06-15 NOTE — H&P (Signed)
GYNECOLOGY PREOPERATIVE HISTORY AND PHYSICAL   Subjective:  Marie Green is a 38 y.o. [id number] here for surgical management of retained products of conception after attempted medical management of missed abortion.  Was diagnosed on 05/30/2023, noted gestational sac at [redacted] weeks gestation. Reports that she is still experiencing some daily bleeding. Bleeding is not heavy, denies passing clots but waxes and wanes from light to moderate flow.   Proposed surgery: Suction Dilation and Curettage    Pertinent Gynecological History: Menses: Patient's last menstrual period was 02/25/2023 (approximate). Contraception: Nexplanon (inserted 2 days ago) Last pap: normal Date: 05/04/2023   Past Medical History:  Diagnosis Date   Anemia    Coma (HCC)    Gestational diabetes 02/2017   HELLP syndrome    Hypertension    Migraines    Preeclampsia     Past Surgical History:  Procedure Laterality Date   NO PAST SURGERIES      OB History  Gravida Para Term Preterm AB Living  7 6 5 1 1 5   SAB IAB Ectopic Multiple Live Births  1   0 5    # Outcome Date GA Lbr Len/2nd Weight Sex Type Anes PTL Lv  7 SAB 05/22/23 [redacted]w[redacted]d         6 Term 06/30/19 [redacted]w[redacted]d / 00:01 3290 g F Vag-Spont None  LIV  5 Term 10/30/16 [redacted]w[redacted]d  3340 g  Vag-Spont  N LIV     Complications: Postpartum hemorrhage  4 Term 09/20/13 [redacted]w[redacted]d  3031 g  Vag-Spont  N LIV     Complications: Gestational hypertension, Gestational diabetes  3 Term 01/19/12 [redacted]w[redacted]d  3530 g  Vag-Spont  N LIV     Complications: Gestational hypertension  2 Preterm 01/31/11 [redacted]w[redacted]d  737 g  Vag-Spont  N FD     Complications: Severe pre-eclampsia  1 Term 05/05/08 [redacted]w[redacted]d  2920 g  Vag-Spont  N LIV    Family History  Problem Relation Age of Onset   Healthy Mother    Healthy Father    Breast cancer Sister    Stroke Brother    Healthy Brother    Healthy Brother    Healthy Brother    Healthy Brother    Healthy Brother    Healthy Brother    Healthy Brother    Seizures  Other    Hypertension Other     Social History   Socioeconomic History   Marital status: Single    Spouse name: Not on file   Number of children: 5   Years of education: 13   Highest education level: Some college, no degree  Occupational History   Not on file  Tobacco Use   Smoking status: Former   Smokeless tobacco: Never  Vaping Use   Vaping status: Never Used  Substance and Sexual Activity   Alcohol use: Not Currently    Comment: occ   Drug use: Yes    Types: Marijuana   Sexual activity: Yes    Birth control/protection: None  Other Topics Concern   Not on file  Social History Narrative   Not on file   Social Drivers of Health   Financial Resource Strain: Medium Risk (05/22/2023)   Overall Financial Resource Strain (CARDIA)    Difficulty of Paying Living Expenses: Somewhat hard  Food Insecurity: No Food Insecurity (05/22/2023)   Hunger Vital Sign    Worried About Running Out of Food in the Last Year: Never true    Ran Out of Food in  the Last Year: Never true  Transportation Needs: No Transportation Needs (05/22/2023)   PRAPARE - Administrator, Civil Service (Medical): No    Lack of Transportation (Non-Medical): No  Physical Activity: Sufficiently Active (05/22/2023)   Exercise Vital Sign    Days of Exercise per Week: 5 days    Minutes of Exercise per Session: 30 min  Stress: No Stress Concern Present (05/22/2023)   [employer] of Occupational Health - Occupational Stress Questionnaire    Feeling of Stress : Not at all  Social Connections: Moderately Integrated (05/22/2023)   Social Connection and Isolation Panel [NHANES]    Frequency of Communication with Friends and Family: More than three times a week    Frequency of Social Gatherings with Friends and Family: More than three times a week    Attends Religious Services: 1 to 4 times per year    Active Member of [employer] or Organizations: No    Attends Banker Meetings: Never    Marital  Status: Living with partner  Intimate Partner Violence: Not At Risk (05/22/2023)   Humiliation, Afraid, Rape, and Kick questionnaire    Fear of Current or Ex-Partner: No    Emotionally Abused: No    Physically Abused: No    Sexually Abused: No    No current facility-administered medications on file prior to encounter.   Current Outpatient Medications on File Prior to Encounter  Medication Sig Dispense Refill   albuterol (VENTOLIN HFA) 108 (90 Base) MCG/ACT inhaler Inhale 2 puffs into the lungs every 6 (six) hours as needed for wheezing or shortness of breath. 18 g 0   escitalopram (LEXAPRO) 10 MG tablet Begin with 1/2 tablet daily on Week 1, then increase to 1 whole tablet starting Week 2. 90 tablet 3   ibuprofen (ADVIL) 800 MG tablet Take 1 tablet (800 mg total) by mouth every 8 (eight) hours as needed for mild pain (pain score 1-3) or moderate pain (pain score 4-6). 30 tablet 0   oxyCODONE-acetaminophen (PERCOCET) 5-325 MG tablet Take 1-2 tablets by mouth every 6 (six) hours as needed for severe pain (pain score 7-10) or moderate pain (pain score 4-6). 4 tablet 0   misoprostol (CYTOTEC) 200 MCG tablet Place 3 tablets (600 mcg total) vaginally once for 1 dose. 3 tablet 1   Allergies  Allergen Reactions   Mushroom Extract Complex (Do Not Select) Anaphylaxis   Amoxicillin    Flagyl [Metronidazole] Rash      Review of Systems Constitutional: No recent fever/chills/sweats Respiratory: No recent cough/bronchitis Cardiovascular: No chest pain Gastrointestinal: No recent nausea/vomiting/diarrhea Genitourinary: No UTI symptoms Hematologic/lymphatic:No history of coagulopathy or recent blood thinner use    Objective:   Blood pressure 123/88, pulse 67, temperature 98.1 F (36.7 C), temperature source Temporal, height 5' 5.5" (1.664 m), weight 80 kg, last menstrual period 02/25/2023, SpO2 100%. CONSTITUTIONAL: Well-developed, well-nourished female in no acute distress.  HENT:   Normocephalic, atraumatic, External right and left ear normal. Oropharynx is clear and moist EYES: Conjunctivae and EOM are normal. Pupils are equal, round, and reactive to light. No scleral icterus.  NECK: Normal range of motion, supple, no masses SKIN: Skin is warm and dry. No rash noted. Not diaphoretic. No erythema. No pallor. NEUROLOGIC: Alert and oriented to person, place, and time. Normal reflexes, muscle tone coordination. No cranial nerve deficit noted. PSYCHIATRIC: Normal mood and affect. Normal behavior. Normal judgment and thought content. CARDIOVASCULAR: Normal heart rate noted, regular rhythm RESPIRATORY: Effort and breath sounds  normal, no problems with respiration noted ABDOMEN: Soft, nontender, nondistended. PELVIC: Deferred MUSCULOSKELETAL: Normal range of motion. No edema and no tenderness. 2+ distal pulses.    Labs: Results for orders placed or performed during the hospital encounter of 06/15/23 (from the past 2 weeks)  CBC   Collection Time: 06/15/23 11:06 AM  Result Value Ref Range   WBC 3.4 (L) 4.0 - 10.5 K/uL   RBC 4.21 3.87 - 5.11 MIL/uL   Hemoglobin 12.0 12.0 - 15.0 g/dL   HCT 16.1 09.6 - 04.5 %   MCV 85.7 80.0 - 100.0 fL   MCH 28.5 26.0 - 34.0 pg   MCHC 33.2 30.0 - 36.0 g/dL   RDW 40.9 81.1 - 91.4 %   Platelets 240 150 - 400 K/uL   nRBC 0.0 0.0 - 0.2 %  hCG, quantitative, pregnancy   Collection Time: 06/15/23 11:06 AM  Result Value Ref Range   hCG, Beta Chain, Quant, S 8 (H) <5 mIU/mL     Latest Reference Range & Units 05/23/23 05:13 05/26/23 10:13 05/30/23 15:30  hCG Quant mIU/mL  4,828 1,015  HCG, Beta Chain, Quant, S <5 mIU/mL 6,346 (H)    (H): Data is abnormally high   Imaging Studies: US OB LESS THAN 14 WEEKS WITH OB TRANSVAGINAL Result Date: 05/23/2023 CLINICAL DATA:  Two day history of spotting and cramping. Beta hCG = 6,346 EXAM: OBSTETRIC <14 WK [place] AND TRANSVAGINAL OB US TECHNIQUE: Both transabdominal and transvaginal ultrasound  examinations were performed for complete evaluation of the gestation as well as the maternal uterus, adnexal regions, and pelvic cul-de-sac. Transvaginal technique was performed to assess early pregnancy. COMPARISON:  Obstetric ultrasound dated 05/08/2023 FINDINGS: Intrauterine gestational sac: Single Yolk sac:  Visualized. Embryo:  Not Visualized. MSD: 9.6 mm   5 w   5 d Subchorionic hemorrhage:  None visualized. Maternal uterus/adnexae: Uterus measures 11.4 cm in sagittal dimension. Endometrium measures 17 mm. Normal ovaries. IMPRESSION: Findings diagnostic of pregnancy failure when compared to initial obstetric ultrasound dated 05/08/2023 demonstrating gestational sac without yolk sac. Electronically Signed   By: Agustin Cree M.D.   On: 05/23/2023 08:46    Assessment:    Retained products of conception   Plan:    Counseling: Procedure, risks, reasons, benefits and complications (including injury to bowel, bladder, major blood vessel, bleeding, possibility of transfusion, infection, or fistula formation) reviewed in detail. Likelihood of success in alleviating the patient's condition was discussed. Routine postoperative instructions will be reviewed with the patient and her family in detail after surgery.  The patient concurred with the proposed plan, giving informed written consent for the surgery.   Preop testing reviewed, HCG levels almost at negative levels. Has been NPO since midnight.      Hildred Laser, MD [facility]

## 2023-06-15 NOTE — Op Note (Signed)
Procedure(s): SUCTION DILATATION AND CURETTAGE Procedure Note  Marie Green female 38 y.o. 06/15/2023  Indications: The patient is a 38 y.o. [id number] female with retained products of conception after medical management of miscarriage with Cytotec.   Pre-operative Diagnosis: Retained products of conception  Post-operative Diagnosis: Same  Surgeon: Hildred Laser, MD  Assistants:  None.   Anesthesia: MAC  anesthesia  Findings: The uterus was sounded to 8 cm. Scant tissue consistent with products of conception was removed and sent to Pathology.   Procedure Details: The patient was seen in the Holding Room. The risks, benefits, complications, treatment options, and expected outcomes were discussed with the patient.  The patient concurred with the proposed plan, giving informed consent.  The site of surgery properly noted/marked. The patient was taken to the Operating Room, identified as Marie Green and the procedure verified as Procedure(s) (LRB): SUCTION DILATATION AND CURETTAGE (N/A). A Time Out was held and the above information confirmed.  She was then placed under general anesthesia without difficulty. She was placed in the dorsal lithotomy position, and was prepped and draped in a sterile manner.  A straight catheterization was performed. A sterile speculum was inserted into the vagina and the cervix was grasped at the anterior lip using a single-toothed tenaculum.  The patient was brought to the operating room where she was placed into the supine position.  MAC anesthesia was induced without incident. She was placed in the dorsal lithotomy position using candy cane stirrups, and was examined; A Betadine prep and drape was performed in sterile fashion.   A  Red Robinson catheter was used to drain the bladder of urine. A weighted speculum was placed into  the vagina and a single tooth tenaculum was applied to the anterior lip of the cervix. The cervix was noted to be at least 0.5 cm  dilated.  The 6 mm suction curette was gently advanced to the uterine fundus.  The suction device was then activated and the curette was slowly rotated to clear the uterine cavity of products of conception.  A sharp curettage with a serrated curette was then performed to confirm complete emptying of the uterus. Minimal bleeding was encountered. The tenaculum was removed along with all instruments  from the  vagina.   The patient was awakened, mobilized and taken to the recovery room in satisfactory condition.  The procedure was well-tolerated.  The patient will be discharged to home as per PACU criteria.  Routine postoperative instructions were given along with a prescription for analgesics.  She will follow up in the clinic in 1-2 weeks for postoperative evaluation.   Estimated Blood Loss:  15 ml      Drains: straight catheterization prior to procedure with 20 ml of clear urine         Total IV Fluids:  400 ml  Specimens: Scant tissue consistent with products of conception         Implants: None         Complications:  None; patient tolerated the procedure well.         Disposition: PACU - hemodynamically stable.         Condition: stable   Hildred Laser, MD [place] [facility]

## 2023-06-15 NOTE — Anesthesia Procedure Notes (Signed)
Procedure Name: MAC Date/Time: 06/15/2023 12:23 PM  Performed by: Katherine Basset, CRNAPre-anesthesia Checklist: Patient identified, Emergency Drugs available, Patient being monitored and Suction available Patient Re-evaluated:Patient Re-evaluated prior to induction Oxygen Delivery Method: Simple face mask

## 2023-06-15 NOTE — Transfer of Care (Signed)
Immediate Anesthesia Transfer of Care Note  Patient: Marie Green  Procedure(s) Performed: SUCTION DILATATION AND CURETTAGE  Patient Location: PACU  Anesthesia Type:General  Level of Consciousness: drowsy  Airway & Oxygen Therapy: Patient Spontanous Breathing and Patient connected to face mask oxygen  Post-op Assessment: Report given to RN, Post -op Vital signs reviewed and stable, and Patient moving all extremities  Post vital signs: Reviewed and stable  Last Vitals:  Vitals Value Taken Time  BP 81/49 06/15/23 1254  Temp    Pulse 55 06/15/23 1255  Resp 11 06/15/23 1255  SpO2 100 % 06/15/23 1255  Vitals shown include unfiled device data.  Last Pain:  Vitals:   06/15/23 1046  TempSrc: Temporal  PainSc: 0-No pain         Complications: No notable events documented.

## 2023-06-15 NOTE — Anesthesia Preprocedure Evaluation (Addendum)
Anesthesia Evaluation  Patient identified by MRN, date of birth, ID band Patient awake    Reviewed: Allergy & Precautions, NPO status , Patient's Chart, lab work & pertinent test results  Airway Mallampati: II  TM Distance: >3 FB Neck ROM: full    Dental  (+) Chipped   Pulmonary neg pulmonary ROS, neg shortness of breath   Pulmonary exam normal        Cardiovascular hypertension, (-) angina Normal cardiovascular exam     Neuro/Psych  Headaches  negative psych ROS   GI/Hepatic negative GI ROS, Neg liver ROS,,,  Endo/Other  diabetes    Renal/GU negative Renal ROS  negative genitourinary   Musculoskeletal   Abdominal   Peds  Hematology negative hematology ROS (+)   Anesthesia Other Findings Past Medical History: No date: Anemia No date: Coma (HCC) 02/2017: Gestational diabetes No date: HELLP syndrome No date: Hypertension No date: Migraines No date: Preeclampsia  Past Surgical History: No date: NO PAST SURGERIES  BMI    Body Mass Index: 28.91 kg/m      Reproductive/Obstetrics negative OB ROS                             Anesthesia Physical Anesthesia Plan  ASA: 2  Anesthesia Plan: General   Post-op Pain Management:    Induction: Intravenous  PONV Risk Score and Plan: Propofol infusion and TIVA  Airway Management Planned: Natural Airway and Nasal Cannula  Additional Equipment:   Intra-op Plan:   Post-operative Plan:   Informed Consent: I have reviewed the patients History and Physical, chart, labs and discussed the procedure including the risks, benefits and alternatives for the proposed anesthesia with the patient or authorized representative who has indicated his/her understanding and acceptance.     Dental Advisory Given  Plan Discussed with: Anesthesiologist, CRNA and Surgeon  Anesthesia Plan Comments: (Patient consented for risks of anesthesia including but  not limited to:  - adverse reactions to medications - risk of airway placement if required - damage to eyes, teeth, lips or other oral mucosa - nerve damage due to positioning  - sore throat or hoarseness - Damage to heart, brain, nerves, lungs, other parts of body or loss of life  Patient voiced understanding and assent.)       Anesthesia Quick Evaluation

## 2023-06-16 ENCOUNTER — Encounter: Payer: Self-pay | Admitting: Obstetrics and Gynecology

## 2023-06-16 LAB — SURGICAL PATHOLOGY

## 2023-06-16 NOTE — Anesthesia Postprocedure Evaluation (Signed)
Anesthesia Post Note  Patient: Marie Green  Procedure(s) Performed: SUCTION DILATATION AND CURETTAGE  Patient location during evaluation: PACU Anesthesia Type: General Level of consciousness: awake and alert Pain management: pain level controlled Vital Signs Assessment: post-procedure vital signs reviewed and stable Respiratory status: spontaneous breathing, nonlabored ventilation, respiratory function stable and patient connected to nasal cannula oxygen Cardiovascular status: blood pressure returned to baseline and stable Postop Assessment: no apparent nausea or vomiting Anesthetic complications: no   No notable events documented.   Last Vitals:  Vitals:   06/15/23 1430 06/15/23 1443  BP: 102/70 (!) 128/96  Pulse: (!) 46 62  Resp: 11 15  Temp:  (!) 36.3 C  SpO2: 100% 100%    Last Pain:  Vitals:   06/15/23 1443  TempSrc: Temporal  PainSc: 0-No pain                 Cleda Mccreedy Bennette Hasty

## 2023-06-17 LAB — TYPE AND SCREEN
ABO/RH(D): B POS
Antibody Screen: NEGATIVE

## 2023-06-30 ENCOUNTER — Encounter: Payer: Medicaid Other | Admitting: Obstetrics and Gynecology

## 2023-07-03 NOTE — Progress Notes (Deleted)
    OBSTETRICS/GYNECOLOGY POST-OPERATIVE CLINIC VISIT  Subjective:     Marie Green is a 38 y.o. female who presents to the clinic 3 weeks status post SUCTION DILATATION AND CURETTAGE for  Retained products of conception . Eating a regular diet {with-without:5700} difficulty. Bowel movements are {normal/abnormal***:19619}. {pain control:13522::"The patient is not having any pain."}  {Common ambulatory SmartLinks:19316}  Review of Systems {ros; complete:30496}   Objective:   LMP 02/25/2023 (Approximate)  There is no height or weight on file to calculate BMI.  General:  alert and no distress  Abdomen: soft, bowel sounds active, non-tender  Incision:   {incision:13716::"no dehiscence","incision well approximated","healing well","no drainage","no erythema","no hernia","no seroma","no swelling"}    Pathology:     Assessment:   Patient s/p SUCTION DILATATION AND CURETTAGE  (surgery)  {doing well:13525::"Doing well postoperatively."}   Plan:   1. Continue any current medications as instructed by provider. 2. Wound care discussed. 3. Operative findings again reviewed. Pathology report discussed. 4. Activity restrictions: {restrictions:13723} 5. Anticipated return to work: {work return:14002}. 6. Follow up: {4-78:29562} {time; units:18646} for ***    Hildred Laser, MD [place] [facility]

## 2023-07-04 ENCOUNTER — Telehealth: Payer: Self-pay | Admitting: Obstetrics and Gynecology

## 2023-07-04 ENCOUNTER — Encounter: Payer: Medicaid Other | Admitting: Obstetrics and Gynecology

## 2023-07-04 NOTE — Telephone Encounter (Signed)
 Reached out to pt to reschedule post op appt that was scheduled on 07/04/2023 at 11:!5.  Could not leave a message bc mailbox was full.

## 2023-07-05 ENCOUNTER — Encounter: Payer: Self-pay | Admitting: Obstetrics and Gynecology

## 2023-07-05 NOTE — Telephone Encounter (Signed)
 Reached out to pt (2x) to reschedule post op appt that was scheduled on 07/04/2023 at 11:15.  Could not leave a message bc mailbox was full.  Will send a MyChart letter.

## 2024-01-06 ENCOUNTER — Inpatient Hospital Stay: Admit: 2024-01-06 | Payer: Medicaid Other
